# Patient Record
Sex: Female | Born: 1955 | Race: Black or African American | Hispanic: No | Smoking: Never smoker
Health system: Southern US, Community
[De-identification: ages and names within clinical notes are randomized; demographics above are authoritative.]

## PROBLEM LIST (undated history)

## (undated) HISTORY — PX: WRIST SURGERY: SHX841

## (undated) HISTORY — PX: TONSILLECTOMY: SUR1361

## (undated) HISTORY — PX: ADENOIDECTOMY: SUR15

---

## 1997-04-10 ENCOUNTER — Other Ambulatory Visit: Admission: RE | Admit: 1997-04-10 | Discharge: 1997-04-10 | Payer: Self-pay | Admitting: Obstetrics

## 1998-07-06 ENCOUNTER — Emergency Department (HOSPITAL_COMMUNITY): Admission: EM | Admit: 1998-07-06 | Discharge: 1998-07-06 | Payer: Self-pay | Admitting: Emergency Medicine

## 1999-05-07 ENCOUNTER — Encounter: Payer: Self-pay | Admitting: Family Medicine

## 1999-05-07 ENCOUNTER — Encounter: Admission: RE | Admit: 1999-05-07 | Discharge: 1999-05-07 | Payer: Self-pay | Admitting: Family Medicine

## 1999-10-06 ENCOUNTER — Encounter: Payer: Self-pay | Admitting: Emergency Medicine

## 1999-10-06 ENCOUNTER — Emergency Department (HOSPITAL_COMMUNITY): Admission: EM | Admit: 1999-10-06 | Discharge: 1999-10-06 | Payer: Self-pay | Admitting: Emergency Medicine

## 1999-10-17 ENCOUNTER — Encounter: Admission: RE | Admit: 1999-10-17 | Discharge: 1999-11-25 | Payer: Self-pay | Admitting: Family Medicine

## 2000-02-17 ENCOUNTER — Encounter: Admission: RE | Admit: 2000-02-17 | Discharge: 2000-03-26 | Payer: Self-pay | Admitting: Family Medicine

## 2000-03-22 ENCOUNTER — Emergency Department (HOSPITAL_COMMUNITY): Admission: EM | Admit: 2000-03-22 | Discharge: 2000-03-23 | Payer: Self-pay | Admitting: Emergency Medicine

## 2000-03-23 ENCOUNTER — Encounter: Payer: Self-pay | Admitting: Emergency Medicine

## 2001-07-14 ENCOUNTER — Emergency Department (HOSPITAL_COMMUNITY): Admission: EM | Admit: 2001-07-14 | Discharge: 2001-07-14 | Payer: Self-pay | Admitting: Emergency Medicine

## 2004-03-24 ENCOUNTER — Emergency Department (HOSPITAL_COMMUNITY): Admission: EM | Admit: 2004-03-24 | Discharge: 2004-03-24 | Payer: Self-pay | Admitting: Emergency Medicine

## 2004-04-02 ENCOUNTER — Ambulatory Visit (HOSPITAL_COMMUNITY): Admission: RE | Admit: 2004-04-02 | Discharge: 2004-04-02 | Payer: Self-pay | Admitting: Family Medicine

## 2004-12-15 ENCOUNTER — Emergency Department (HOSPITAL_COMMUNITY): Admission: EM | Admit: 2004-12-15 | Discharge: 2004-12-15 | Payer: Self-pay | Admitting: Emergency Medicine

## 2005-06-24 ENCOUNTER — Other Ambulatory Visit: Payer: Self-pay

## 2005-06-24 ENCOUNTER — Emergency Department: Payer: Self-pay | Admitting: Internal Medicine

## 2006-05-20 ENCOUNTER — Emergency Department (HOSPITAL_COMMUNITY): Admission: EM | Admit: 2006-05-20 | Discharge: 2006-05-20 | Payer: Self-pay | Admitting: Family Medicine

## 2006-12-27 ENCOUNTER — Emergency Department (HOSPITAL_COMMUNITY): Admission: EM | Admit: 2006-12-27 | Discharge: 2006-12-27 | Payer: Self-pay | Admitting: Emergency Medicine

## 2007-01-01 ENCOUNTER — Emergency Department (HOSPITAL_COMMUNITY): Admission: EM | Admit: 2007-01-01 | Discharge: 2007-01-01 | Payer: Self-pay | Admitting: Family Medicine

## 2007-04-18 ENCOUNTER — Emergency Department (HOSPITAL_COMMUNITY): Admission: EM | Admit: 2007-04-18 | Discharge: 2007-04-18 | Payer: Self-pay | Admitting: Emergency Medicine

## 2008-07-17 ENCOUNTER — Emergency Department (HOSPITAL_COMMUNITY): Admission: EM | Admit: 2008-07-17 | Discharge: 2008-07-17 | Payer: Self-pay | Admitting: Emergency Medicine

## 2009-08-08 ENCOUNTER — Encounter: Admission: RE | Admit: 2009-08-08 | Discharge: 2009-08-08 | Payer: Self-pay | Admitting: Orthopedic Surgery

## 2009-08-08 ENCOUNTER — Emergency Department (HOSPITAL_COMMUNITY): Admission: EM | Admit: 2009-08-08 | Discharge: 2009-08-09 | Payer: Self-pay | Admitting: Emergency Medicine

## 2009-08-10 ENCOUNTER — Ambulatory Visit (HOSPITAL_COMMUNITY): Admission: RE | Admit: 2009-08-10 | Discharge: 2009-08-11 | Payer: Self-pay | Admitting: Orthopedic Surgery

## 2010-03-21 LAB — COMPREHENSIVE METABOLIC PANEL
ALT: 32 U/L (ref 0–35)
Alkaline Phosphatase: 69 U/L (ref 39–117)
Calcium: 8.9 mg/dL (ref 8.4–10.5)
GFR calc non Af Amer: >60 mL/min (ref >60)
Glucose, Bld: 111 mg/dL — ABNORMAL HIGH (ref 70–99)
Potassium: 3.4 mEq/L — ABNORMAL LOW (ref 3.5–5.1)
Sodium: 143 mEq/L (ref 135–145)
Total Bilirubin: 0.6 mg/dL (ref 0.3–1.2)
Total Protein: 6.4 g/dL (ref 6.0–8.3)

## 2010-03-21 LAB — SURGICAL PCR SCREEN
MRSA, PCR: NEGATIVE
Staphylococcus aureus: NEGATIVE

## 2010-03-21 LAB — CBC
Platelets: 232 10*3/uL (ref 150–400)
RBC: 4 MIL/uL (ref 3.87–5.11)
RDW: 13.6 % (ref 11.5–15.5)

## 2010-10-10 LAB — INFLUENZA A AND B ANTIGEN (CONVERTED LAB)
Inflenza A Ag: POSITIVE — AB
Influenza B Ag: NEGATIVE

## 2011-08-03 ENCOUNTER — Ambulatory Visit (INDEPENDENT_AMBULATORY_CARE_PROVIDER_SITE_OTHER): Payer: BC Managed Care – PPO | Admitting: Internal Medicine

## 2011-08-03 ENCOUNTER — Ambulatory Visit: Payer: BC Managed Care – PPO

## 2011-08-03 VITALS — BP 112/72 | HR 69 | Temp 98.6°F | Resp 16

## 2011-08-03 DIAGNOSIS — M25569 Pain in unspecified knee: Secondary | ICD-10-CM

## 2011-08-03 DIAGNOSIS — M25469 Effusion, unspecified knee: Secondary | ICD-10-CM

## 2011-08-03 DIAGNOSIS — E669 Obesity, unspecified: Secondary | ICD-10-CM

## 2011-08-03 MED ORDER — MELOXICAM 15 MG PO TABS: 15.0000 mg | ORAL_TABLET | Freq: Every day | ORAL | Status: AC

## 2011-08-03 NOTE — Progress Notes (Signed)
  Subjective:    Patient ID: Deanna Park, female    DOB: Jun 29, 1955, 56 y.o.   MRN: [id number]  HPIFive-day history of severe pain the left knee/started while sitting at a desk Now hurts so bad she can't bear weight even to walk to the bathroom/she thinks there is some swelling Over the past few years she has noticed trouble with her knee stepping up but has had no injury She was here 18 months ago for left back pain with radicular symptoms into the left posterior thigh left knee This has not been a problem consistently since that visit No fever/no urinary changes    Past medical history-no illnesses or medications Review of Systems There is no other medical symptoms    Objective:   Physical Exam Vital signs stable except marked obesity Obviously uncomfortable Cannot fully extend the left knee The knee is nonswollen and there is no effusion She is very tender in the popliteal fossa extending into the very distal part of the posterior thigh Calf is nontender Varus valgus and drawer are stable/ Patellar ballots freely Nontender over the lumbar spine/straight leg raise is negative bilaterally There is no distal extremity edema/Peripheral pulses are full Just above the knee the left is 1 cm larger than the right      [facility] reading (PRIMARY) by  Dr. Arlin Savona=Normal knee   Assessment & Plan:  Problem #1 knee pain Mobic 15 #31 daily Walker for support Doppler ultrasound to rule out DVT and Baker's cyst

## 2013-01-20 ENCOUNTER — Ambulatory Visit: Payer: BC Managed Care – PPO

## 2013-01-20 ENCOUNTER — Ambulatory Visit (INDEPENDENT_AMBULATORY_CARE_PROVIDER_SITE_OTHER): Payer: BC Managed Care – PPO | Admitting: Emergency Medicine

## 2013-01-20 VITALS — BP 110/80 | HR 63 | Temp 98.7°F | Resp 16 | Ht 67.75 in | Wt 285.0 lb

## 2013-01-20 DIAGNOSIS — M25579 Pain in unspecified ankle and joints of unspecified foot: Secondary | ICD-10-CM

## 2013-01-20 DIAGNOSIS — M549 Dorsalgia, unspecified: Secondary | ICD-10-CM

## 2013-01-20 DIAGNOSIS — M5430 Sciatica, unspecified side: Secondary | ICD-10-CM

## 2013-01-20 MED ORDER — TRAMADOL HCL 50 MG PO TABS: 50.0000 mg | ORAL_TABLET | Freq: Three times a day (TID) | ORAL | Status: DC | PRN

## 2013-01-20 NOTE — Patient Instructions (Signed)
Sciatica °Sciatica is pain, weakness, numbness, or tingling along the path of the sciatic nerve. The nerve starts in the lower back and runs down the back of each leg. The nerve controls the muscles in the lower leg and in the back of the knee, while also providing sensation to the back of the thigh, lower leg, and the sole of your foot. Sciatica is a symptom of another medical condition. For instance, nerve damage or certain conditions, such as a herniated disk or bone spur on the spine, pinch or put pressure on the sciatic nerve. This causes the pain, weakness, or other sensations normally associated with sciatica. Generally, sciatica only affects one side of the body. °CAUSES  °· Herniated or slipped disc. °· Degenerative disk disease. °· A pain disorder involving the narrow muscle in the buttocks (piriformis syndrome). °· Pelvic injury or fracture. °· Pregnancy. °· Tumor (rare). °SYMPTOMS  °Symptoms can vary from mild to very severe. The symptoms usually travel from the low back to the buttocks and down the back of the leg. Symptoms can include: °· Mild tingling or dull aches in the lower back, leg, or hip. °· Numbness in the back of the calf or sole of the foot. °· Burning sensations in the lower back, leg, or hip. °· Sharp pains in the lower back, leg, or hip. °· Leg weakness. °· Severe back pain inhibiting movement. °These symptoms may get worse with coughing, sneezing, laughing, or prolonged sitting or standing. Also, being overweight may worsen symptoms. °DIAGNOSIS  °Your caregiver will perform a physical exam to look for common symptoms of sciatica. He or she may ask you to do certain movements or activities that would trigger sciatic nerve pain. Other tests may be performed to find the cause of the sciatica. These may include: °· Blood tests. °· X-rays. °· Imaging tests, such as an MRI or CT scan. °TREATMENT  °Treatment is directed at the cause of the sciatic pain. Sometimes, treatment is not necessary  and the pain and discomfort goes away on its own. If treatment is needed, your caregiver may suggest: °· Over-the-counter medicines to relieve pain. °· Prescription medicines, such as anti-inflammatory medicine, muscle relaxants, or narcotics. °· Applying heat or ice to the painful area. °· Steroid injections to lessen pain, irritation, and inflammation around the nerve. °· Reducing activity during periods of pain. °· Exercising and stretching to strengthen your abdomen and improve flexibility of your spine. Your caregiver may suggest losing weight if the extra weight makes the back pain worse. °· Physical therapy. °· Surgery to eliminate what is pressing or pinching the nerve, such as a bone spur or part of a herniated disk. °HOME CARE INSTRUCTIONS  °· Only take over-the-counter or prescription medicines for pain or discomfort as directed by your caregiver. °· Apply ice to the affected area for 20 minutes, 3 4 times a day for the first 48 72 hours. Then try heat in the same way. °· Exercise, stretch, or perform your usual activities if these do not aggravate your pain. °· Attend physical therapy sessions as directed by your caregiver. °· Keep all follow-up appointments as directed by your caregiver. °· Do not wear high heels or shoes that do not provide proper support. °· Check your mattress to see if it is too soft. A firm mattress may lessen your pain and discomfort. °SEEK IMMEDIATE MEDICAL CARE IF:  °· You lose control of your bowel or bladder (incontinence). °· You have increasing weakness in the lower back,   pelvis, buttocks, or legs. °· You have redness or swelling of your back. °· You have a burning sensation when you urinate. °· You have pain that gets worse when you lie down or awakens you at night. °· Your pain is worse than you have experienced in the past. °· Your pain is lasting longer than 4 weeks. °· You are suddenly losing weight without reason. °MAKE SURE YOU: °· Understand these  instructions. °· Will watch your condition. °· Will get help right away if you are not doing well or get worse. °Document Released: 12/16/2000 Document Revised: 06/23/2011 Document Reviewed: 05/03/2011 °ExitCare® Patient Information ©2014 [employer]. ° °

## 2013-01-20 NOTE — Progress Notes (Addendum)
[facility] [address], [place] [address] [phone]  Date:  01/20/2013   Name:  Deanna Park   DOB:  1955/11/12   MRN:  [id number]  PCP:  No primary provider on file.    Chief Complaint: Leg Pain   History of Present Illness:  Deanna Park is a 58 y.o. very pleasant female patient who presents with the following:  Tripped and fell in October and injured her left midfoot. Says it swelled and hurt at the time.  No ecchymosis.  Still has sharp pain with standing and ambulation. No recurrent injury.  Developed pain in the left lower back after the fall and since has had numbness in the left thigh.  Some weakness in knee when climbing stairs.  Says leg has not "gone out".   No history of prior back injury or pain.  No improvement with over the counter medications or other home remedies.  Denies other complaint or health concern today.    There are no active problems to display for this patient.   History reviewed. No pertinent past medical history.  History reviewed. No pertinent past surgical history.  History  Substance Use Topics  . Smoking status: Never Smoker   . Smokeless tobacco: Not on file  . Alcohol Use: Not on file    Family History  Problem Relation Age of Onset  . Cancer Mother   . Diabetes Father   . Heart disease Father     Allergies  Allergen Reactions  . Sulfa Antibiotics Itching and Rash    Medication list has been reviewed and updated.  Current Outpatient Prescriptions on File Prior to Visit  Medication Sig Dispense Refill  . acetaminophen (TYLENOL) 500 MG tablet Take 500 mg by mouth every 6 (six) hours as needed.       No current facility-administered medications on file prior to visit.    Review of Systems:  As per HPI, otherwise negative.    Physical Examination: Filed Vitals:   01/20/13 1441  BP: 110/80  Pulse: 63  Temp: 98.7 F (37.1 C)  Resp: 16   Filed Vitals:   01/20/13 1441   Height: 5' 7.75" (1.721 m)  Weight: 285 lb (129.275 kg)   Body mass index is 43.65 kg/(m^2). Ideal Body Weight: Weight in (lb) to have BMI = 25: 162.9  GEN: morbidly obese, moderate distress, Non-toxic, A & O x 3 HEENT: Atraumatic, Normocephalic. Neck supple. No masses, No LAD. Ears and Nose: No external deformity. CV: RRR, No M/G/R. No JVD. No thrill. No extra heart sounds. PULM: CTA B, no wheezes, crackles, rhonchi. No retractions. No resp. distress. No accessory muscle use. ABD: S, NT, ND, +BS. No rebound. No HSM. EXTR: No c/c/e NEURO antalgic gait. Gross motor and cerebellar intact.   Hypesthesia left lateral thigh PSYCH: Normally interactive. Conversant. Not depressed or anxious appearing.  Calm demeanor.  Back:  Tender sciatic notch on left.  Assessment and Plan: Sciatic neuritis Paresthesias Ultram  Signed,  Phillips OdorJeffery Teren Zurcher, MD   [facility] reading (PRIMARY) by  Dr. Dareen PianoAnderson.  Negative LS spine.  [facility] reading (PRIMARY) by  Dr. Dareen PianoAnderson.  Negative foot.

## 2013-01-24 ENCOUNTER — Telehealth: Payer: Self-pay

## 2013-01-24 NOTE — Telephone Encounter (Signed)
Authorization # - [id number] expires 01/21/2013

## 2013-01-24 NOTE — Telephone Encounter (Signed)
[employer] insurance) left referrals vmail requesting peer to peer for authorization.  [id number]  bf

## 2013-01-24 NOTE — Telephone Encounter (Signed)
VM did not include a case #, but most current ins ID # is [id number].

## 2013-08-29 ENCOUNTER — Ambulatory Visit (INDEPENDENT_AMBULATORY_CARE_PROVIDER_SITE_OTHER): Payer: BC Managed Care – PPO | Admitting: Family Medicine

## 2013-08-29 ENCOUNTER — Ambulatory Visit (INDEPENDENT_AMBULATORY_CARE_PROVIDER_SITE_OTHER): Payer: BC Managed Care – PPO

## 2013-08-29 VITALS — BP 114/68 | HR 65 | Temp 97.7°F | Resp 16 | Ht 68.0 in | Wt 285.6 lb

## 2013-08-29 DIAGNOSIS — J209 Acute bronchitis, unspecified: Secondary | ICD-10-CM

## 2013-08-29 DIAGNOSIS — J22 Unspecified acute lower respiratory infection: Secondary | ICD-10-CM

## 2013-08-29 DIAGNOSIS — R05 Cough: Secondary | ICD-10-CM

## 2013-08-29 DIAGNOSIS — R059 Cough, unspecified: Secondary | ICD-10-CM

## 2013-08-29 DIAGNOSIS — J988 Other specified respiratory disorders: Secondary | ICD-10-CM

## 2013-08-29 MED ORDER — HYDROCODONE-HOMATROPINE 5-1.5 MG/5ML PO SYRP: 5.0000 mL | ORAL_SOLUTION | Freq: Every evening | ORAL | Status: DC | PRN

## 2013-08-29 MED ORDER — BENZONATATE 100 MG PO CAPS: 200.0000 mg | ORAL_CAPSULE | Freq: Two times a day (BID) | ORAL | Status: DC | PRN

## 2013-08-29 MED ORDER — AZITHROMYCIN 250 MG PO TABS: ORAL_TABLET | ORAL | Status: DC

## 2013-08-29 MED ORDER — ALBUTEROL SULFATE HFA 108 (90 BASE) MCG/ACT IN AERS: 2.0000 | INHALATION_SPRAY | Freq: Four times a day (QID) | RESPIRATORY_TRACT | Status: DC | PRN

## 2013-08-29 NOTE — Progress Notes (Signed)
Chief Complaint:  Chief Complaint  Patient presents with  . Cough    x 1 month  . Wheezing    x 2 weeks at night     HPI: Deanna Park is a 58 y.o. female who is here URI sxs for last 3-4 week  since she came back from [place] , she has had a productive cough of clear sputum  and intermittent wheezing and has sinus issues, she had been there before and her sinuses where acting up and she took otc meds and it resolved, but this last visit, she has been wheezing and coughing ever since , she has phlegm and sometimes the coughing was so bad she felt she was being strangled , she has a tickle in the back of her throat. She is not taking anythign for her sinuses. 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 She does not have asthma. She has had bronchtiis before. She denies fevers or chills.   No past medical history on file. No past surgical history on file. History   Social History  . Marital Status: Legally Separated    Spouse Name: N/A    Number of Children: N/A  . Years of Education: N/A   Social History Main Topics  . Smoking status: Never Smoker   . Smokeless tobacco: None  . Alcohol Use: No  . Drug Use: No  . Sexual Activity: None   Other Topics Concern  . None   Social History Narrative  . None   Family History  Problem Relation Age of Onset  . Cancer Mother   . Diabetes Father   . Heart disease Father    Allergies  Allergen Reactions  . Sulfa Antibiotics Itching and Rash   Prior to Admission medications   Not on File     ROS: The patient denies fevers, chills, night sweats, unintentional weight loss, chest pain, palpitation, dyspnea on exertion, nausea, vomiting, abdominal pain, dysuria, hematuria, melena, numbness, weakness, or tingling.   All other  systems have been reviewed and were otherwise negative with the exception of those mentioned in the HPI and as above.    PHYSICAL EXAM: Filed Vitals:   08/29/13 1132  BP: 114/68  Pulse: 65  Temp: 97.7 F (36.5 C)  Resp: 16   Filed Vitals:   08/29/13 1132  Height: $Remove'5\' 8"'Tsbotoi$  (1.727 m)  Weight: 285 lb 9.6 oz (129.547 kg)   Body mass index is 43.44 kg/(m^2).  General: Alert, no acute distress HEENT:  Normocephalic, atraumatic, oropharynx patent. EOMI, PERRLA, tm normal, + min sinus tenderness, no exudates Cardiovascular:  Regular rate and rhythm, no rubs murmurs or gallops.  No Carotid bruits, radial pulse intact. No pedal edema.  Respiratory: Clear to auscultation bilaterally.  No wheezes, rales, or rhonchi.  No cyanosis, no use of accessory musculature GI: No organomegaly, abdomen is soft and non-tender, positive bowel sounds.  No masses. Skin: No rashes. Neurologic: Facial musculature symmetric. Psychiatric: Patient is appropriate throughout our interaction. Lymphatic: No cervical lymphadenopathy Musculoskeletal: Gait intact.   LABS: Results for orders placed during the hospital encounter of 08/10/09  SURGICAL PCR SCREEN      Result Value Ref Range   MRSA, PCR NEGATIVE  NEGATIVE   Staphylococcus aureus  NEGATIVE   Value: NEGATIVE            The Xpert SA Assay (FDA     approved for NASAL specimens     only), is one component of     a comprehensive surveillance     program.  It is not intended     to diagnose infection nor to     guide or monitor treatment.  CBC      Result Value Ref Range   WBC 9.9  4.0 - 10.5 K/uL   RBC 4.00  3.87 - 5.11 MIL/uL   Hemoglobin 11.2 (*) 12.0 - 15.0 g/dL   HCT 32.4 (*) 36.0 - 46.0 %   MCV 81.0  78.0 - 100.0 fL   MCH 28.0  26.0 - 34.0 pg   MCHC 34.6  30.0 - 36.0 g/dL   RDW 13.6  11.5 - 15.5 %   Platelets 232  150 - 400 K/uL  COMPREHENSIVE METABOLIC PANEL      Result Value Ref Range   Sodium 143  135 - 145 mEq/L   Potassium 3.4 (*) 3.5  - 5.1 mEq/L   Chloride 109  96 - 112 mEq/L   CO2 26  19 - 32 mEq/L   Glucose, Bld 111 (*) 70 - 99 mg/dL   BUN 5 (*) 6 - 23 mg/dL   Creatinine, Ser 0.75  0.4 - 1.2 mg/dL   Calcium 8.9  8.4 - 10.5 mg/dL   Total Protein 6.4  6.0 - 8.3 g/dL   Albumin 3.5  3.5 - 5.2 g/dL   AST 63 (*) 0 - 37 U/L   ALT 32  0 - 35 U/L   Alkaline Phosphatase 69  39 - 117 U/L   Total Bilirubin 0.6  0.3 - 1.2 mg/dL   GFR calc non Af Amer >60  >60 mL/min   GFR calc Af Amer    >60 mL/min   Value: >60            The eGFR has been calculated     using the MDRD equation.     This calculation has not been     validated in all clinical     situations.     eGFR's persistently     <60 mL/min signify     possible Chronic Kidney Disease.     EKG/XRAY:   Primary read interpreted by Dr. Marin [facility] at [facility]. No acute caridopulmonary process   ASSESSMENT/PLAN: Encounter Diagnoses  Name Primary?  . Cough Yes  . Lower respiratory infection   . Acute bronchitis, unspecified organism    Rx zpack, tessalon perles, hycodan, and albuterol inhaler Otc nasacort and also antihistamine  Work note given F/u prn  Gross sideeffects, risk and benefits, and alternatives of medications d/w patient. Patient is aware that all medications have potential sideeffects and we are unable to predict every sideeffect or drug-drug interaction that may occur.  Mairyn Lenahan, [place], DO 08/29/2013 1:07 PM

## 2013-08-29 NOTE — Patient Instructions (Signed)
Acute Bronchitis Bronchitis is inflammation of the airways that extend from the windpipe into the lungs (bronchi). The inflammation often causes mucus to develop. This leads to a cough, which is the most common symptom of bronchitis.  In acute bronchitis, the condition usually develops suddenly and goes away over time, usually in a couple weeks. Smoking, allergies, and asthma can make bronchitis worse. Repeated episodes of bronchitis may cause further lung problems.  CAUSES Acute bronchitis is most often caused by the same virus that causes a cold. The virus can spread from person to person (contagious) through coughing, sneezing, and touching contaminated objects. SIGNS AND SYMPTOMS   Cough.   Fever.   Coughing up mucus.   Body aches.   Chest congestion.   Chills.   Shortness of breath.   Sore throat.  DIAGNOSIS  Acute bronchitis is usually diagnosed through a physical exam. Your health care provider will also ask you questions about your medical history. Tests, such as chest X-rays, are sometimes done to rule out other conditions.  TREATMENT  Acute bronchitis usually goes away in a couple weeks. Oftentimes, no medical treatment is necessary. Medicines are sometimes given for relief of fever or cough. Antibiotic medicines are usually not needed but may be prescribed in certain situations. In some cases, an inhaler may be recommended to help reduce shortness of breath and control the cough. A cool mist vaporizer may also be used to help thin bronchial secretions and make it easier to clear the chest.  HOME CARE INSTRUCTIONS  Get plenty of rest.   Drink enough fluids to keep your urine clear or pale yellow (unless you have a medical condition that requires fluid restriction). Increasing fluids may help thin your respiratory secretions (sputum) and reduce chest congestion, and it will prevent dehydration.   Take medicines only as directed by your health care provider.  If  you were prescribed an antibiotic medicine, finish it all even if you start to feel better.  Avoid smoking and secondhand smoke. Exposure to cigarette smoke or irritating chemicals will make bronchitis worse. If you are a smoker, consider using nicotine gum or skin patches to help control withdrawal symptoms. Quitting smoking will help your lungs heal faster.   Reduce the chances of another bout of acute bronchitis by washing your hands frequently, avoiding people with cold symptoms, and trying not to touch your hands to your mouth, nose, or eyes.   Keep all follow-up visits as directed by your health care provider.  SEEK MEDICAL CARE IF: Your symptoms do not improve after 1 week of treatment.  SEEK IMMEDIATE MEDICAL CARE IF:  You develop an increased fever or chills.   You have chest pain.   You have severe shortness of breath.  You have bloody sputum.   You develop dehydration.  You faint or repeatedly feel like you are going to pass out.  You develop repeated vomiting.  You develop a severe headache. MAKE SURE YOU:   Understand these instructions.  Will watch your condition.  Will get help right away if you are not doing well or get worse. Document Released: 01/30/2004 Document Revised: 05/08/2013 Document Reviewed: 06/14/2012 ExitCare Patient Information 2015 [employer]. This information is not intended to replace advice given to you by your health care provider. Make sure you discuss any questions you have with your health care provider. Sinusitis Sinusitis is redness, soreness, and inflammation of the paranasal sinuses. Paranasal sinuses are air pockets within the bones of your face (beneath the   eyes, the middle of the forehead, or above the eyes). In healthy paranasal sinuses, mucus is able to drain out, and air is able to circulate through them by way of your nose. However, when your paranasal sinuses are inflamed, mucus and air can become trapped. This can  allow bacteria and other germs to grow and cause infection. Sinusitis can develop quickly and last only a short time (acute) or continue over a long period (chronic). Sinusitis that lasts for more than 12 weeks is considered chronic.  CAUSES  Causes of sinusitis include:  Allergies.  Structural abnormalities, such as displacement of the cartilage that separates your nostrils (deviated septum), which can decrease the air flow through your nose and sinuses and affect sinus drainage.  Functional abnormalities, such as when the small hairs (cilia) that line your sinuses and help remove mucus do not work properly or are not present. SIGNS AND SYMPTOMS  Symptoms of acute and chronic sinusitis are the same. The primary symptoms are pain and pressure around the affected sinuses. Other symptoms include:  Upper toothache.  Earache.  Headache.  Bad breath.  Decreased sense of smell and taste.  A cough, which worsens when you are lying flat.  Fatigue.  Fever.  Thick drainage from your nose, which often is green and may contain pus (purulent).  Swelling and warmth over the affected sinuses. DIAGNOSIS  Your health care provider will perform a physical exam. During the exam, your health care provider may:  Look in your nose for signs of abnormal growths in your nostrils (nasal polyps).  Tap over the affected sinus to check for signs of infection.  View the inside of your sinuses (endoscopy) using an imaging device that has a light attached (endoscope). If your health care provider suspects that you have chronic sinusitis, one or more of the following tests may be recommended:  Allergy tests.  Nasal culture. A sample of mucus is taken from your nose, sent to a lab, and screened for bacteria.  Nasal cytology. A sample of mucus is taken from your nose and examined by your health care provider to determine if your sinusitis is related to an allergy. TREATMENT  Most cases of acute  sinusitis are related to a viral infection and will resolve on their own within 10 days. Sometimes medicines are prescribed to help relieve symptoms (pain medicine, decongestants, nasal steroid sprays, or saline sprays).  However, for sinusitis related to a bacterial infection, your health care provider will prescribe antibiotic medicines. These are medicines that will help kill the bacteria causing the infection.  Rarely, sinusitis is caused by a fungal infection. In theses cases, your health care provider will prescribe antifungal medicine. For some cases of chronic sinusitis, surgery is needed. Generally, these are cases in which sinusitis recurs more than 3 times per year, despite other treatments. HOME CARE INSTRUCTIONS   Drink plenty of water. Water helps thin the mucus so your sinuses can drain more easily.  Use a humidifier.  Inhale steam 3 to 4 times a day (for example, sit in the bathroom with the shower running).  Apply a warm, moist washcloth to your face 3 to 4 times a day, or as directed by your health care provider.  Use saline nasal sprays to help moisten and clean your sinuses.  Take medicines only as directed by your health care provider.  If you were prescribed either an antibiotic or antifungal medicine, finish it all even if you start to feel better. SEEK IMMEDIATE MEDICAL   CARE IF:  You have increasing pain or severe headaches.  You have nausea, vomiting, or drowsiness.  You have swelling around your face.  You have vision problems.  You have a stiff neck.  You have difficulty breathing. MAKE SURE YOU:   Understand these instructions.  Will watch your condition.  Will get help right away if you are not doing well or get worse. Document Released: 12/22/2004 Document Revised: 05/08/2013 Document Reviewed: 01/06/2011 ExitCare Patient Information 2015 [employer]. This information is not intended to replace advice given to you by your health care provider.  Make sure you discuss any questions you have with your health care provider.  

## 2013-09-20 ENCOUNTER — Ambulatory Visit (INDEPENDENT_AMBULATORY_CARE_PROVIDER_SITE_OTHER): Payer: BC Managed Care – PPO

## 2015-08-25 ENCOUNTER — Encounter (HOSPITAL_COMMUNITY): Payer: Self-pay | Admitting: Family Medicine

## 2015-08-25 ENCOUNTER — Ambulatory Visit (HOSPITAL_COMMUNITY)
Admission: EM | Admit: 2015-08-25 | Discharge: 2015-08-25 | Disposition: A | Discharge status: Home / Self Care | Payer: BLUE CROSS/BLUE SHIELD | Attending: Family Medicine | Admitting: Family Medicine

## 2015-08-25 DIAGNOSIS — M545 Low back pain: Secondary | ICD-10-CM | POA: Diagnosis not present

## 2015-08-25 MED ORDER — PREDNISONE 20 MG PO TABS: ORAL_TABLET | ORAL | 0 refills | Status: DC

## 2015-08-25 NOTE — ED Provider Notes (Addendum)
[facility]    CSN: [id number] Arrival date & time: 08/25/15  1228  First Provider Contact:  First MD Initiated Contact with Patient 08/25/15 1349        History   Chief Complaint Chief Complaint  Patient presents with  . Back Pain    HPI Deanna Park is a 60 y.o. female.   This is 60 year old woman who presents with back pain. She first started having the pain August 1. The pain seemed to be relieved by ibuprofen but when the pain was less, she started doing more and the pain worsen.  She is on a bus several days ago and [place] [facility] seemed aggravate the back.  Patient works as an Wellsite geologistart teacher in school system.  Patient denies fever, urinary incontinence, fecal incontinence, radiating pain down her legs, weakness, numbness, or significant trauma.  Patient has had some relief using a back brace.      History reviewed. No pertinent past medical history.  There are no active problems to display for this patient.   Past Surgical History:  Procedure Laterality Date  . WRIST SURGERY      OB History    No data available       Home Medications    Prior to Admission medications   Medication Sig Start Date End Date Taking? Authorizing Provider  albuterol (PROVENTIL HFA;VENTOLIN HFA) 108 (90 BASE) MCG/ACT inhaler Inhale 2 puffs into the lungs every 6 (six) hours as needed for wheezing or shortness of breath. 08/29/13   Thao P Le, DO  benzonatate (TESSALON) 100 MG capsule Take 2 capsules (200 mg total) by mouth 2 (two) times daily as needed. 08/29/13   Thao P Le, DO  predniSONE (DELTASONE) 20 MG tablet Two daily with food 08/25/15   Elvina SidleKurt Reyanna Baley, MD    Family History Family History  Problem Relation Age of Onset  . Cancer Mother   . Diabetes Father   . Heart disease Father     Social History Social History  Substance Use Topics  . Smoking status: Never Smoker  . Smokeless tobacco: Not on file  . Alcohol  use No     Allergies   Azithromycin; Red dye; and Sulfa antibiotics   Review of Systems Review of Systems   Physical Exam Triage Vital Signs ED Triage Vitals  Enc Vitals Group     BP      Pulse      Resp      Temp      Temp src      SpO2      Weight      Height      Head Circumference      Peak Flow      Pain Score      Pain Loc      Pain Edu?      Excl. in GC?    No data found.   Updated Vital Signs BP 133/72   Pulse 70   Temp 97.1 F (36.2 C) (Oral)   Resp 18   SpO2 98%   Visual Acuity Right Eye Distance:   Left Eye Distance:   Bilateral Distance:    Right Eye Near:   Left Eye Near:    Bilateral Near:     Physical Exam  Constitutional: She is oriented to person, place, and time. She appears well-developed and well-nourished.  HENT:  Head: Normocephalic.  Eyes: Conjunctivae are normal. Pupils are equal,  round, and reactive to light.  Neck: Normal range of motion. Neck supple.  Pulmonary/Chest: Effort normal.  Abdominal: Soft.  Musculoskeletal: Normal range of motion.  Neurological: She is alert and oriented to person, place, and time.  Normal muscle mass bilaterally Normal straight leg raising   Skin: Skin is warm and dry.  Psychiatric: She has a normal mood and affect.  Nursing note and vitals reviewed.    UC Treatments / Results  Labs (all labs ordered are listed, but only abnormal results are displayed) Labs Reviewed - No data to display  EKG  EKG Interpretation None       Radiology No results found.  Procedures Procedures (including critical care time)  Medications Ordered in UC Medications - No data to display   Initial Impression / Assessment and Plan / UC Course  I have reviewed the triage vital signs and the nursing notes.  Pertinent labs & imaging results that were available during my care of the patient were reviewed by me and considered in my medical decision making (see chart for details).  Clinical Course       Final Clinical Impressions(s) / UC Diagnoses   Final diagnoses:  Right low back pain, with sciatica presence unspecified    New Prescriptions New Prescriptions   PREDNISONE (DELTASONE) 20 MG TABLET    Two daily with food  Lumbar support, out of work until Wednesday   Elvina SidleKurt Elika Godar, MD 08/25/15 1406    Elvina SidleKurt Keeshia Sanderlin, MD 08/25/15 2024

## 2015-08-25 NOTE — ED Triage Notes (Signed)
Reports fall 8/1 from tripping; had been applying heat and wearing a "back wrap" which helped, then aggravated pain again 2 days ago when she tripped again.

## 2015-08-25 NOTE — Discharge Instructions (Signed)
You'll need to pick up your lumbar support had either a medical supply office or the pharmacy.

## 2016-03-01 ENCOUNTER — Ambulatory Visit (HOSPITAL_COMMUNITY)
Admission: EM | Admit: 2016-03-01 | Discharge: 2016-03-01 | Disposition: A | Discharge status: Home / Self Care | Payer: BLUE CROSS/BLUE SHIELD | Attending: Family Medicine | Admitting: Family Medicine

## 2016-03-01 ENCOUNTER — Encounter (HOSPITAL_COMMUNITY): Payer: Self-pay | Admitting: *Deleted

## 2016-03-01 DIAGNOSIS — J0100 Acute maxillary sinusitis, unspecified: Secondary | ICD-10-CM | POA: Diagnosis not present

## 2016-03-01 DIAGNOSIS — M12812 Other specific arthropathies, not elsewhere classified, left shoulder: Secondary | ICD-10-CM

## 2016-03-01 DIAGNOSIS — R05 Cough: Secondary | ICD-10-CM | POA: Diagnosis not present

## 2016-03-01 DIAGNOSIS — R059 Cough, unspecified: Secondary | ICD-10-CM

## 2016-03-01 MED ORDER — PREDNISONE 20 MG PO TABS: ORAL_TABLET | ORAL | 0 refills | Status: DC

## 2016-03-01 MED ORDER — AMOXICILLIN-POT CLAVULANATE 875-125 MG PO TABS: 1.0000 | ORAL_TABLET | Freq: Two times a day (BID) | ORAL | 0 refills | Status: DC

## 2016-03-01 NOTE — ED Triage Notes (Signed)
Also c/o left arm pain without known injury.  Pain remains constant; pain worse with ROM and particular movements.

## 2016-03-01 NOTE — ED Triage Notes (Signed)
Started 8 days ago with runny nose, congestion, sinus pressure, sore throat, feeling feverish.  Has been taking Vit C & ASA.

## 2016-03-01 NOTE — ED Provider Notes (Signed)
[facility]    CSN: [id number] Arrival date & time: 03/01/16  1333     History   Chief Complaint Chief Complaint  Patient presents with  . Nasal Congestion  . Facial Pain  . Arm Pain    HPI Deanna Park is a 61 y.o. female.   This is 61 year old woman who has 2 different problems. She has upper respiratory symptoms with cough and congestion. She also has left arm pain.  She's had a cough for about a week. This is developed into facial pain as well.    she's had left arm and shoulder pain since she was moving some costumes several weeks ago. She has pain when she reaches behind her back and when she raises her arm overhead. When she is at rest, she has no pain      History reviewed. No pertinent past medical history.  There are no active problems to display for this patient.   Past Surgical History:  Procedure Laterality Date  . WRIST SURGERY      OB History    No data available       Home Medications    Prior to Admission medications   Medication Sig Start Date End Date Taking? Authorizing Provider  amoxicillin-clavulanate (AUGMENTIN) 875-125 MG tablet Take 1 tablet by mouth every 12 (twelve) hours. 03/01/16   Elvina Sidle, MD  predniSONE (DELTASONE) 20 MG tablet Two daily with food 03/01/16   Elvina Sidle, MD    Family History Family History  Problem Relation Age of Onset  . Cancer Mother   . Diabetes Father   . Heart disease Father     Social History Social History  Substance Use Topics  . Smoking status: Never Smoker  . Smokeless tobacco: Not on file  . Alcohol use No     Allergies   Azithromycin; Red dye; and Sulfa antibiotics   Review of Systems Review of Systems  Constitutional: Negative.   HENT: Positive for sinus pain, sinus pressure and sore throat.   Respiratory: Positive for cough.   Cardiovascular: Negative.   Gastrointestinal: Negative.   Musculoskeletal: Positive for arthralgias.  Neurological:  Negative.      Physical Exam Triage Vital Signs ED Triage Vitals  Enc Vitals Group     BP 03/01/16 1358 127/61     Pulse Rate 03/01/16 1358 70     Resp 03/01/16 1358 18     Temp 03/01/16 1358 98.6 F (37 C)     Temp Source 03/01/16 1358 Oral     SpO2 03/01/16 1358 98 %     Weight --      Height --      Head Circumference --      Peak Flow --      Pain Score 03/01/16 1359 0     Pain Loc --      Pain Edu? --      Excl. in GC? --    No data found.   Updated Vital Signs BP 127/61   Pulse 70   Temp 98.6 F (37 C) (Oral)   Resp 18   SpO2 98%    Physical Exam  Constitutional: She is oriented to person, place, and time. She appears well-developed and well-nourished.  HENT:  Head: Normocephalic.  Right Ear: External ear normal.  Left Ear: External ear normal.  Mouth/Throat: Oropharynx is clear and moist.  Eyes: Conjunctivae and EOM are normal. Pupils are equal, round, and reactive to light.  Neck: Normal  range of motion. Neck supple.  Cardiovascular: Normal rate, regular rhythm and normal heart sounds.   Pulmonary/Chest: Effort normal and breath sounds normal.  Musculoskeletal: Normal range of motion. She exhibits no tenderness.  Pain when abducting left arm or reaching behind her back  Neurological: She is alert and oriented to person, place, and time.  Skin: Skin is warm and dry.  Vitals reviewed.    UC Treatments / Results  Labs (all labs ordered are listed, but only abnormal results are displayed) Labs Reviewed - No data to display  EKG  EKG Interpretation None       Radiology No results found.  Procedures Procedures (including critical care time)  Medications Ordered in UC Medications - No data to display   Initial Impression / Assessment and Plan / UC Course  I have reviewed the triage vital signs and the nursing notes.  Pertinent labs & imaging results that were available during my care of the patient were reviewed by me and considered in  my medical decision making (see chart for details).     Final Clinical Impressions(s) / UC Diagnoses   Final diagnoses:  Cough  Acute non-recurrent maxillary sinusitis    New Prescriptions New Prescriptions   AMOXICILLIN-CLAVULANATE (AUGMENTIN) 875-125 MG TABLET    Take 1 tablet by mouth every 12 (twelve) hours.   PREDNISONE (DELTASONE) 20 MG TABLET    Two daily with food     Elvina SidleKurt Athan Casalino, MD 03/01/16 1429

## 2016-06-15 ENCOUNTER — Ambulatory Visit (HOSPITAL_COMMUNITY)
Admission: EM | Admit: 2016-06-15 | Discharge: 2016-06-15 | Disposition: A | Discharge status: Home / Self Care | Payer: BLUE CROSS/BLUE SHIELD | Attending: Internal Medicine | Admitting: Internal Medicine

## 2016-06-15 ENCOUNTER — Encounter (HOSPITAL_COMMUNITY): Payer: Self-pay | Admitting: Emergency Medicine

## 2016-06-15 ENCOUNTER — Telehealth (HOSPITAL_COMMUNITY): Payer: Self-pay | Admitting: Emergency Medicine

## 2016-06-15 ENCOUNTER — Ambulatory Visit (INDEPENDENT_AMBULATORY_CARE_PROVIDER_SITE_OTHER): Payer: BLUE CROSS/BLUE SHIELD

## 2016-06-15 DIAGNOSIS — M25571 Pain in right ankle and joints of right foot: Secondary | ICD-10-CM | POA: Diagnosis not present

## 2016-06-15 DIAGNOSIS — S0093XA Contusion of unspecified part of head, initial encounter: Secondary | ICD-10-CM

## 2016-06-15 DIAGNOSIS — S93401A Sprain of unspecified ligament of right ankle, initial encounter: Secondary | ICD-10-CM

## 2016-06-15 MED ORDER — NAPROXEN 500 MG PO TABS: 500.0000 mg | ORAL_TABLET | Freq: Two times a day (BID) | ORAL | 0 refills | Status: DC
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
## 2016-06-15 NOTE — Telephone Encounter (Signed)
[facility]. Patient was advised STD tests were negative and to follow up with PCP in reference to Urine Culture if symptoms continued.
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
## 2016-06-15 NOTE — Discharge Instructions (Addendum)
No danger signs on exam today.  Ice for 5-10 minutes several times daily will help with pain/swelling on forehead and right ankle.  Wear stirrup splint on right ankle to help with discomfort.  Prescription for naproxen, an anti inflammatory, was sent to the pharmacy.  Anticipate gradual improvement over the next several days in right ankle pain and left forehead pain.  Recheck for further evaluation if symptoms are not improving as expected.

## 2016-07-11 ENCOUNTER — Encounter: Payer: Self-pay | Admitting: Family Medicine

## 2016-07-11 ENCOUNTER — Ambulatory Visit (INDEPENDENT_AMBULATORY_CARE_PROVIDER_SITE_OTHER): Payer: BLUE CROSS/BLUE SHIELD | Admitting: Family Medicine

## 2016-07-11 ENCOUNTER — Ambulatory Visit (INDEPENDENT_AMBULATORY_CARE_PROVIDER_SITE_OTHER): Payer: BLUE CROSS/BLUE SHIELD

## 2016-07-11 VITALS — BP 124/70 | HR 65 | Temp 99.1°F | Resp 16 | Ht 68.0 in | Wt 283.2 lb

## 2016-07-11 DIAGNOSIS — M949 Disorder of cartilage, unspecified: Secondary | ICD-10-CM

## 2016-07-11 DIAGNOSIS — S99921A Unspecified injury of right foot, initial encounter: Secondary | ICD-10-CM | POA: Diagnosis not present

## 2016-07-11 DIAGNOSIS — M899 Disorder of bone, unspecified: Secondary | ICD-10-CM | POA: Diagnosis not present

## 2016-07-11 DIAGNOSIS — S99911A Unspecified injury of right ankle, initial encounter: Secondary | ICD-10-CM | POA: Diagnosis not present

## 2016-07-11 NOTE — Patient Instructions (Addendum)
IF you received an x-ray today, you will receive an invoice from [facility]. Please contact [facility] at [phone] with questions or concerns regarding your invoice.   IF you received labwork today, you will receive an invoice from [place]. Please contact [facility] at [phone] with questions or concerns regarding your invoice.   Our billing staff will not be able to assist you with questions regarding bills from these companies.  You will be contacted with the lab results as soon as they are available. The fastest way to get your results is to activate your My Chart account. Instructions are located on the last page of this paperwork. If you have not heard from [place] regarding the results in 2 weeks, please contact this office.    We recommend that you schedule a mammogram for breast cancer screening. Typically, you do not need a referral to do this. Please contact a local imaging center to schedule your mammogram.  [facility] - [phone]  *ask for the Radiology Department [facility]) - [phone] or [phone]  [facility] - [phone] [facility] - [phone] [facility] - [phone]  *ask for the Radiology Department [facility] - [phone]  *ask for the Radiology Department [facility] - [phone]  *ask for the Mammography Department [facility] - [phone]   Walking Boot, Adult A walking boot is a medical device that holds your foot or ankle in place after an injury or a medical procedure. This helps with healing and prevents further injury. A walking boot is a removable boot-shaped splint. It has a hard, rigid outer frame that limits movement and supports your foot and leg. The inner lining is a layer of padded material. Walking boots usually have adjustable straps to secure them over the foot and leg. Your health care  provider may prescribe a walking boot if you can put weight (bear weight) on your injured foot. How much you can walk while wearing the boot will depend on the type and severity of your injury. Your health care provider will recommend the best boot for you based on your condition. How do I put on my walking boot? There are different types of walking boots. Each type of boot has specific instructions about how to wear it properly. Follow instructions from your health care provider about wearing your boot. In general:  Ask someone to help you put on the boot, if needed.  Sit to put on your boot. Doing this is more comfortable and it helps to prevent falls.  Open up the boot fully. Place your foot into the boot so your heel rests against the back.  Your toes should be supported by the base of the boot. They should not hang over the front edge.  Adjust the straps so the boot fits securely but is not too tight.  Do not bend the hard frame of the boot to get a good fit.  What are some tips for walking with a walking boot?  Do not try to walk without wearing the boot unless your health care provider has approved.  Use other assistive walking devices, including crutches and canes, as told by your health care provider.  On your uninjured foot, wear a shoe with a heel that is close to the height of the walking boot.  Be careful when walking on surfaces that are uneven or wet. How can I reduce  swelling while using a walking boot?  Rest your injured foot or leg as much as possible.  If directed, apply ice to the injured area: ? Put ice in a plastic bag. ? Place a towel between your skin and the bag. ? Leave the ice on for 20 minutes, 2-3 times a day.  Keep your injured foot or leg raised (elevated) above the level of your heart whenever able. Try to do this for at least 2?3 hours each day or as told by your health care provider.  If swelling gets worse, loosen the boot and rest and elevate  your foot and leg. How should I take care of my skin and foot while using a walking boot?  Wear a long sock to protect your foot and leg from rubbing inside the boot.  Take off the boot one time each day to check the injured area. Look at your foot, surrounding skin, and leg to make sure there are no sores, rashes, swelling, or wounds. The skin should be a healthy color, not pale or blue.  Try to notice if your walking pattern (gait) in the boot is fairly normal and that you are not walking with a noticeable limp.  Follow instructions from your health care provider about taking care of your incision or wound, if this applies.  Clean and wash the injured area as told by your health care provider.  Gently dry your foot and leg before putting the boot back on. Are there any activity restrictions? Activity restrictions depend on the type and severity of your injury. Follow instructions from your health care provider about limiting activities.  Bathe and shower as told by your health care provider.  Do not do any activities that could make your injury worse.  Do not drive if your affected foot is one that you usually use for driving.  When can I remove my walking boot? Always follow specific directions from your health care provider for removing the walking boot. Generally, it is okay to remove your walking boot:  At the end of the day when you are resting or sleeping.  To clean your foot and leg.  How should I keep my walking boot clean?  Do not put any part of the boot in a washing machine or dryer.  Do not use chemical cleaning products. These could irritate your skin, especially if you have a wound or an incision.  Do not soak the liner of the boot.  Use a washcloth with mild soap and water to clean the frame and the liner of the boot by hand.  Allow the boot to air-dry completely before you put it back on your foot. Contact a health care provider if:  The boot is cracked or  damaged.  The boot does not fit properly.  Your foot or leg hurts.  You have a rash, sore, or open sore (ulcer) on your foot or leg.  The skin on your foot or leg is pale.  You have a wound or incision on the foot and it is getting worse.  Your skin becomes painful, red, or irritated.  Your swelling does not get better or it gets worse. Get help right away if:  You cannot feel your foot or leg (have numbness).  You cannot feel a pulse at the top of your foot, where your foot and ankle meet.  Your skin on the foot or leg is cold, blue, or gray. Summary  A walking boot holds your foot  or ankle in place after an injury or a medical procedure.  There are different types of walking boots. Follow the specific instructions about how to correctly wear the boot that you have.  Ask someone to help you put on the boot, if needed.  It is important to check your skin and foot every day. Call your health care provider if you notice a rash or sore on your foot or leg. This information is not intended to replace advice given to you by your health care provider. Make sure you discuss any questions you have with your health care provider. Document Released: 05/08/2014 Document Revised: 01/30/2016 Document Reviewed: 01/30/2016 Elsevier Interactive Patient Education  2018 [employer] Inc.   Ankle Sprain An ankle sprain is a stretch or tear in one of the tough, fiber-like tissues (ligaments) in the ankle. The ligaments in your ankle help to hold the bones of the ankle together. What are the causes? This condition is often caused by stepping on or falling on the outer edge of the foot. What increases the risk? This condition is more likely to develop in people who play sports. What are the signs or symptoms? Symptoms of this condition include:  Pain in your ankle.  Swelling.  Bruising. Bruising may develop right after you sprain your ankle or 1-2 days later.  Trouble standing or walking,  especially when you turn or change directions.  How is this diagnosed? This condition is diagnosed with a physical exam. During the exam, your health care provider will press on certain parts of your foot and ankle and try to move them in certain ways. X-rays may be taken to see how severe the sprain is and to check for broken bones. How is this treated? This condition may be treated with:  A brace. This is used to keep the ankle from moving until it heals.  An elastic bandage. This is used to support the ankle.  Crutches.  Pain medicine.  Surgery. This may be needed if the sprain is severe.  Physical therapy. This may help to improve the range of motion in the ankle.  Follow these instructions at home:  Rest your ankle.  Take over-the-counter and prescription medicines only as told by your health care provider.  For 2-3 days, keep your ankle raised (elevated) above the level of your heart as much as possible.  If directed, apply ice to the area: ? Put ice in a plastic bag. ? Place a towel between your skin and the bag. ? Leave the ice on for 20 minutes, 2-3 times a day.  If you were given a brace: ? Wear it as directed. ? Remove it to shower or bathe. ? Try not to move your ankle much, but wiggle your toes from time to time. This helps to prevent swelling.  If you were given an elastic bandage (dressing): ? Remove it to shower or bathe. ? Try not to move your ankle much, but wiggle your toes from time to time. This helps to prevent swelling. ? Adjust the dressing to make it more comfortable if it feels too tight. ? Loosen the dressing if you have numbness or tingling in your foot, or if your foot becomes cold and blue.  If you have crutches, use them as told by your health care provider. Continue to use them until you can walk without feeling pain in your ankle. Contact a health care provider if:  You have rapidly increasing bruising or swelling.  Your pain is  not  relieved with medicine. Get help right away if:  Your toes or foot becomes numb or blue.  You have severe pain that gets worse. This information is not intended to replace advice given to you by your health care provider. Make sure you discuss any questions you have with your health care provider. Document Released: 12/22/2004 Document Revised: 05/01/2015 Document Reviewed: 07/24/2014 Elsevier Interactive Patient Education  2017 [employer].

## 2016-07-11 NOTE — Progress Notes (Addendum)
By signing my name below, I, Mesha Guinyard, attest that this documentation has been prepared under the direction and in the presence of Norberto SorensonEva Shaw, MD.  Electronically Signed: Arvilla MarketMesha Guinyard, Medical Scribe. 07/11/16. 11:46 AM.  Subjective:    Patient ID: Deanna Park, female    DOB: [birth year], 61 y.o.   MRN: [id number]  HPI Chief Complaint  Patient presents with   Foot Injury    RIGHT x 1 month ago    HPI Comments: Deanna KillRobin Simmons Park is a 61 y.o. female who [facility] complaining of right foot injury onset 1 month ago. Pt stumbled over a box without falling, but the way she caught her foot injured her foot. Pt had it checked out and was told it was a soft tissue injury, but not told were it was injured. Pt was given a stirrup splint shoe that strapped on the side and she was told to ice it but wasn't sure where to ice her foot. Reports associated sxs of  pain in her distal metatarsals, burning on the lateral metatarsals at night. Pt has been rubbing her foot with castor oil without relief of her sxs. She has been wearing compression socks with ease on her toes and hill. She can move her toes without difficulty.  There are no active problems to display for this patient.  No past medical history on file. Past Surgical History:  Procedure Laterality Date   WRIST SURGERY     Allergies  Allergen Reactions   Azithromycin     Caused diarrhea   Red Dye    Sulfa Antibiotics Itching and Rash   Prior to Admission medications   Medication Sig Start Date End Date Taking? Authorizing Provider  naproxen (NAPROSYN) 500 MG tablet Take 1 tablet (500 mg total) by mouth 2 (two) times daily. 06/15/16  Yes Eustace MooreMurray, Laura W, MD   Social History   Social History   Marital status: Legally Separated    Spouse name: N/A   Number of children: N/A   Years of education: N/A   Occupational History   Not on file.   Social History Main Topics   Smoking  status: Never Smoker   Smokeless tobacco: Never Used   Alcohol use No     Comment: occas   Drug use: No   Sexual activity: Not on file   Other Topics Concern   Not on file   Social History Narrative   No narrative on file   Review of Systems  Cardiovascular: Positive for leg swelling.  Musculoskeletal: Positive for arthralgias.  see hpi Objective:  Physical Exam  Constitutional: She appears well-developed and well-nourished. No distress.  HENT:  Head: Normocephalic and atraumatic.  Eyes: Conjunctivae are normal.  Neck: Neck supple.  Cardiovascular: Normal rate.   Pulses:      Dorsalis pedis pulses are 2+ on the right side.       Posterior tibial pulses are 2+ on the right side.  Pulmonary/Chest: Effort normal.  Musculoskeletal: She exhibits edema (3+ pitting edema in foot extending to upper calf).  Ankle pain at the medial aspect with plantar flexing TTP over medial malleolus TTP just anteriorly and superiorly Positive squeeze test at the tibfib No taylor shift No significant pain with metatarsal squeeze 5/5 strength on plantar and dorsal flexion Severe TTP over the distal 3rd and 4th on ventrum on foot  Neurological: She is alert.  Skin: Skin is warm and dry.  Psychiatric: She has a  normal mood and affect. Her behavior is normal.  Nursing note and vitals reviewed.   Vitals:   07/11/16 1138  BP: 124/70  Pulse: 65  Resp: 16  Temp: 99.1 F (37.3 C)  TempSrc: Oral  SpO2: 99%  Weight: 283 lb 3.2 oz (128.5 kg)  Height: 5\' 8"  (1.727 m)   Body mass index is 43.06 kg/m.   Dg Ankle Complete Right  Result Date: 07/11/2016 CLINICAL DATA:  Tripped 1 month ago.  Medial malleolar pain. EXAM: RIGHT ANKLE - COMPLETE 3+ VIEW COMPARISON:  06/15/2016 FINDINGS: No fracture. No subluxation. Lucent lesion in the medial aspect of the talus may be related to osteochondral injury. Soft tissue swelling noted. 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  Ankle MRI would be helpful to further evaluate. Electronically Signed   By: Kennith Center M.D.   On: 07/11/2016 12:26   Dg Ankle Complete Right  Result Date: 06/15/2016 CLINICAL DATA:  Patient states that she began having rt ankle pain after tripping over a box at work yesterday. EXAM: RIGHT ANKLE - COMPLETE 3+ VIEW COMPARISON:  None. FINDINGS: Osseous alignment is normal. No fracture line or displaced fracture fragment seen. Ankle mortise is symmetric. Probable soft tissue swelling. Degenerative spurring noted within the midfoot, incompletely imaged. IMPRESSION: 1. No acute appearing osseous abnormality. No fracture line or displaced fracture fragment. 2. Probable soft tissue swelling. 3. Degenerative spurring in the midfoot, incompletely imaged. Electronically Signed   By: Bary Richard M.D.   On: 06/15/2016 15:59   Dg Foot Complete Right  Result Date: 07/11/2016 CLINICAL DATA:  Injury 1 month ago with persistent pain. EXAM: RIGHT FOOT COMPLETE - 3+ VIEW COMPARISON:  None. FINDINGS: Degenerative changes noted MTP joint great toe. No evidence of fracture. No subluxation or dislocation. Degenerative changes are noted in the tibiotalar joint. IMPRESSION: Degenerative changes without acute bony abnormality. Electronically Signed   By: Kennith Center M.D.   On: 07/11/2016 12:28    Assessment & Plan:   Per UTD: Treatment consists of a non-weightbearing short-leg cast for six weeks, followed by gradual resumption of weight-bearing in a cast boot for two to four weeks, and finally a gradual return to normal activity   1. Right foot injury, initial encounter   2. Right ankle injury, initial encounter   3. Osteochondral lesion of talar dome - injury was 1 mo prior and pt has been using a crutch as a cane and using compression sleeve over ankle but no other rehab and has had continued pain so will refer to podiatry for further eval and management. In the interim, placed pt in a tall cam boot and encouraged her  to use both crutches to only weight-bear as much as tol - pt states feel much better.  Ice qid.    Orders Placed This Encounter  Procedures   DG Ankle Complete Right    Standing Status:   Future    Number of Occurrences:   1    Standing Expiration Date:   07/11/2017    Order Specific Question:   Reason for Exam (SYMPTOM  OR DIAGNOSIS REQUIRED)    Answer:   tripped 1 mo ago, still on crutches and bracing with pain at medial maleous, distal tib fib, distal 3rd/4th metatarsal with soft tissue swelling    Order Specific Question:   Preferred imaging location?    Answer:   External   DG Foot Complete Right    Standing Status:   Future    Number of  Occurrences:   1    Standing Expiration Date:   07/11/2017    Order Specific Question:   Reason for Exam (SYMPTOM  OR DIAGNOSIS REQUIRED)    Answer:   tripped 1 mo ago, still on crutches and bracing with pain at medial maleous, distal tib fib, distal 3rd/4th metatarsal with soft tissue swelling    Order Specific Question:   Preferred imaging location?    Answer:   External   Ambulatory referral to Podiatry    Referral Priority:   Routine    Referral Type:   Consultation    Referral Reason:   Specialty Services Required    Requested Specialty:   Podiatry    Number of Visits Requested:   1    I personally performed the services described in this documentation, which was scribed in my presence. The recorded information has been reviewed and considered, and addended by me as needed.   Norberto Sorenson, M.D.  Primary Care at [facility] [address] [place], [place] [address] [phone] phone [phone] fax  07/13/16 11:48 PM

## 2016-07-27 ENCOUNTER — Ambulatory Visit (INDEPENDENT_AMBULATORY_CARE_PROVIDER_SITE_OTHER): Payer: BLUE CROSS/BLUE SHIELD | Admitting: Physician Assistant

## 2016-07-27 ENCOUNTER — Encounter: Payer: Self-pay | Admitting: Physician Assistant

## 2016-07-27 VITALS — BP 133/81 | HR 57 | Temp 98.2°F | Resp 18 | Ht 67.32 in | Wt 283.0 lb

## 2016-07-27 DIAGNOSIS — Z124 Encounter for screening for malignant neoplasm of cervix: Secondary | ICD-10-CM | POA: Diagnosis not present

## 2016-07-27 DIAGNOSIS — Z1329 Encounter for screening for other suspected endocrine disorder: Secondary | ICD-10-CM | POA: Diagnosis not present

## 2016-07-27 DIAGNOSIS — Z23 Encounter for immunization: Secondary | ICD-10-CM

## 2016-07-27 DIAGNOSIS — Z13228 Encounter for screening for other metabolic disorders: Secondary | ICD-10-CM

## 2016-07-27 DIAGNOSIS — Z130 Encounter for screening for diseases of the blood and blood-forming organs and certain disorders involving the immune mechanism: Secondary | ICD-10-CM

## 2016-07-27 DIAGNOSIS — Z111 Encounter for screening for respiratory tuberculosis: Secondary | ICD-10-CM

## 2016-07-27 DIAGNOSIS — Z0000 Encounter for general adult medical examination without abnormal findings: Secondary | ICD-10-CM

## 2016-07-27 DIAGNOSIS — Z13220 Encounter for screening for lipoid disorders: Secondary | ICD-10-CM

## 2016-07-27 NOTE — Patient Instructions (Addendum)
TB skin test Reading: you need to get it read between Wednesday after 12:10 and before Thursday before 12:10    IF you received an x-ray today, you will receive an invoice from [facility]. Please contact [facility] at [phone] with questions or concerns regarding your invoice.   IF you received labwork today, you will receive an invoice from [facility]. Please contact [facility] at [phone] with questions or concerns regarding your invoice.   Our billing staff will not be able to assist you with questions regarding bills from these companies.  You will be contacted with the lab results as soon as they are available. The fastest way to get your results is to activate your My Chart account. Instructions are located on the last page of this paperwork. If you have not heard from us regarding the results in 2 weeks, please contact this office.    Keeping You Healthy  Get These Tests  Blood Pressure- Have your blood pressure checked by your healthcare provider at least once a year.  Normal blood pressure is 120/80.  Weight- Have your body mass index (BMI) calculated to screen for obesity.  BMI is a measure of body fat based on height and weight.  You can calculate your own BMI at https://www.west-esparza.com/www.nhlbisupport.com/bmi/  Cholesterol- Have your cholesterol checked every year.  Diabetes- Have your blood sugar checked every year if you have high blood pressure, high cholesterol, a family history of diabetes or if you are overweight.  Pap Test - Have a pap test every 1 to 5 years if you have been sexually active.  If you are older than 65 and recent pap tests have been normal you may not need additional pap tests.  In addition, if you have had a hysterectomy  for benign disease additional pap tests are not necessary.  Mammogram-Yearly mammograms are essential for early detection of breast cancer  Screening for Colon Cancer- Colonoscopy starting at age 63. Screening may begin sooner  depending on your family history and other health conditions.  Follow up colonoscopy as directed by your Gastroenterologist.  Screening for Osteoporosis- Screening begins at age 63 with bone density scanning, sooner if you are at higher risk for developing Osteoporosis.  Get these medicines  Calcium with Vitamin D- Your body requires 1200-1500 mg of Calcium a day and [phone] IU of Vitamin D a day.  You can only absorb 500 mg of Calcium at a time therefore Calcium must be taken in 2 or 3 separate doses throughout the day.  Hormones- Hormone therapy has been associated with increased risk for certain cancers and heart disease.  Talk to your healthcare provider about if you need relief from menopausal symptoms.  Aspirin- Ask your healthcare provider about taking Aspirin to prevent Heart Disease and Stroke.  Get these Immuniztions  Flu shot- Every fall  Pneumonia shot- Once after the age of 63; if you are younger ask your healthcare provider if you need a pneumonia shot.  Tetanus- Every ten years.  Zostavax- Once after the age of 63 to prevent shingles.  Take these steps  Don't smoke- Your healthcare provider can help you quit. For tips on how to quit, ask your healthcare provider or go to www.smokefree.gov or call 1-800 QUIT-NOW.  Be physically active- Exercise 5 days a week for a minimum of 30 minutes.  If you are not already physically active, start slow and gradually work up to 30 minutes of moderate physical activity.  Try walking, dancing, bike riding, swimming, etc.  Eat a healthy diet- Eat a variety of healthy foods such as fruits, vegetables, whole grains, low fat milk, low fat cheeses, yogurt, lean meats, chicken, fish, eggs, dried beans, tofu, etc.  For more information go to www.thenutritionsource.org  Dental visit- Brush and floss teeth twice daily; visit your dentist twice a year.  Eye exam- Visit your Optometrist or Ophthalmologist yearly.  Drink alcohol in moderation-  Limit alcohol intake to one drink or less a day.  Never drink and drive.  Depression- Your emotional health is as important as your physical health.  If you're feeling down or losing interest in things you normally enjoy, please talk to your healthcare provider.  Seat Belts- can save your life; always wear one  Smoke/Carbon Monoxide detectors- These detectors need to be installed on the appropriate level of your home.  Replace batteries at least once a year.  Violence- If anyone is threatening or hurting you, please tell your healthcare provider.  Living Will/ Health care power of attorney- Discuss with your healthcare provider and family.

## 2016-07-27 NOTE — Progress Notes (Signed)
PRIMARY CARE AT [place], [place] [address] [phone]  Date:  07/27/2016   Name:  Deanna Park   DOB:  [birth year]   MRN:  [id number]  PCP:  System, Provider Not In    History of Present Illness:  Deanna Park is a 61 y.o. female patient who presents to PCP with  Chief Complaint  Patient presents with  . Annual Exam     DIET: fish, pescatarian.  Caffeine with one cup of tea.  Water intake: 8-10 bottles per day  BM: NORMAL.  No blood or black stool.  No constipatioin or diarrhea.  URINATION: no dysuria, hematuria, or frequency.    SLEEP: sleep is good.  8 hours per night.    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   EtOH: 1 wine rarely.    Tobacco or vaping: none Illicit drug use: none Sexually active: none at this time.    MENSES: almost 2 decades ago.  No vaginal bleeding since.  No abnormal pap smear.  No hx of std.   2 children--vaginal births.   She will also need a tb test for her work.  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  Currently in a boot for a ligament strain possibly.  Followed by podiatry. Wt Readings from Last 3 Encounters:  07/27/16 283 lb (128.4 kg)  07/11/16 283 lb 3.2 oz (128.5 kg)  09/20/13 282 lb 9.6 oz (128.2 kg)     There are no active problems to display for this patient.   No past medical history on file.  Past Surgical History:  Procedure Laterality Date  . WRIST SURGERY      Social History  Substance Use Topics  . Smoking status: Never Smoker  . Smokeless tobacco: Never Used  . Alcohol use No     Comment: occas    Family History  Problem Relation Age of Onset  . Cancer Mother   . Diabetes Father   . Heart disease Father     Allergies  Allergen Reactions  . Azithromycin     Caused diarrhea  . Red Dye Itching and Rash  . Sulfa Antibiotics Itching and Rash    Medication list has been reviewed and updated.  Current Outpatient Prescriptions on File Prior to  Visit  Medication Sig Dispense Refill  . naproxen (NAPROSYN) 500 MG tablet Take 1 tablet (500 mg total) by mouth 2 (two) times daily. (Patient not taking: Reported on 07/27/2016) 14 tablet 0   No current facility-administered medications on file prior to visit.     Review of Systems  Constitutional: Negative for chills and fever.  HENT: Negative for ear discharge, ear pain and sore throat.   Eyes: Negative for blurred vision and double vision.  Respiratory: Negative for cough, shortness of breath and wheezing.   Cardiovascular: Negative for chest pain, palpitations and leg swelling.  Gastrointestinal: Negative for diarrhea, nausea and vomiting.  Genitourinary: Negative for dysuria, frequency and hematuria.  Musculoskeletal: Positive for joint pain (foot pain, followed by podiatry).  Skin: Negative for itching and rash.  Neurological: Negative for dizziness and headaches.   ROS otherwise unremarkable unless listed above.  Physical Examination: BP 133/81   Pulse (!) 57   Temp 98.2 F (36.8 C) (Oral)   Resp 18   Ht 5' 7.32" (1.71 m)   Wt 283 lb (128.4 kg)   SpO2 98%   BMI 43.90 kg/m  Ideal Body Weight: Weight  in (lb) to have BMI = 25: 160.8  Physical Exam  Constitutional: She is oriented to person, place, and time. She appears well-developed and well-nourished. No distress.  HENT:  Head: Normocephalic and atraumatic.  Right Ear: Tympanic membrane, external ear and ear canal normal.  Left Ear: Tympanic membrane, external ear and ear canal normal.  Nose: Right sinus exhibits no maxillary sinus tenderness and no frontal sinus tenderness. Left sinus exhibits no maxillary sinus tenderness and no frontal sinus tenderness.  Mouth/Throat: Oropharynx is clear and moist. No uvula swelling. No oropharyngeal exudate, posterior oropharyngeal edema or posterior oropharyngeal erythema.  Eyes: Pupils are equal, round, and reactive to light. Conjunctivae and EOM are normal.  Neck: Normal range  of motion. Neck supple. No thyromegaly present.  Cardiovascular: Normal rate, regular rhythm, normal heart sounds and intact distal pulses.  Exam reveals no gallop, no distant heart sounds and no friction rub.   No murmur heard. Pulmonary/Chest: Effort normal and breath sounds normal. No respiratory distress. She has no decreased breath sounds. She has no wheezes. She has no rhonchi.  Abdominal: Soft. Bowel sounds are normal. She exhibits no distension and no mass. There is no tenderness.  Musculoskeletal: Normal range of motion. She exhibits no edema or tenderness.  Lymphadenopathy:       Head (right side): No submandibular, no tonsillar, no preauricular and no posterior auricular adenopathy present.       Head (left side): No submandibular, no tonsillar, no preauricular and no posterior auricular adenopathy present.    She has no cervical adenopathy.  Neurological: She is alert and oriented to person, place, and time. No cranial nerve deficit. She exhibits normal muscle tone. Coordination normal.  Skin: Skin is warm and dry. She is not diaphoretic.  Psychiatric: She has a normal mood and affect. Her behavior is normal.     Visual Acuity Screening   Right eye Left eye Both eyes  Without correction:     With correction: 20/13-2 20/10-2 20/10-2    Assessment and Plan: Deanna Park is a 61 y.o. female who is here today for cc of annual physical exam and to complete form. Tb test performed.   Labs pending. Annual physical exam - Plan: TB Skin Test, CBC, CMP14+EGFR, TSH, Lipid panel, Pap IG w/ reflex to HPV when ASC-U  Screening for deficiency anemia - Plan: CBC  Screening for metabolic disorder - Plan: CMP14+EGFR  Screening for thyroid disorder - Plan: TSH  Screening for lipid disorders - Plan: Lipid panel  PPD screening test - Plan: TB Skin Test  Screening for cervical cancer - Plan: Pap IG w/ reflex to HPV when ASC-U  Need for Tdap vaccination - Plan: Tdap vaccine  greater than or equal to 7yo IM  Ivar Drape, PA-C [facility] 7/24/20188:58 AM

## 2016-07-27 NOTE — Progress Notes (Signed)

## 2016-07-28 ENCOUNTER — Ambulatory Visit (INDEPENDENT_AMBULATORY_CARE_PROVIDER_SITE_OTHER): Payer: PRIVATE HEALTH INSURANCE | Admitting: Podiatry

## 2016-07-28 ENCOUNTER — Ambulatory Visit: Payer: PRIVATE HEALTH INSURANCE

## 2016-07-28 ENCOUNTER — Encounter: Payer: Self-pay | Admitting: Podiatry

## 2016-07-28 DIAGNOSIS — M779 Enthesopathy, unspecified: Secondary | ICD-10-CM

## 2016-07-28 LAB — PAP IG W/ RFLX HPV ASCU: PAP Smear Comment: 0

## 2016-07-28 MED ORDER — METHYLPREDNISOLONE 4 MG PO TBPK: ORAL_TABLET | ORAL | 0 refills | Status: DC

## 2016-07-28 NOTE — Progress Notes (Signed)
   Subjective:    Patient ID: Roger Killobin Simmons Blount, female    DOB: [birth year], 61 y.o.   MRN: [id number]  HPI  Chief Complaint  Patient presents with  . Foot Pain    Across top Rt foot close to ankle and swelling onset June 9th, injury    She presents today chief complaint of a painful right foot and ankle she states that back in June she kicked a box and has developed swelling around the ankle due to this injury. She is currently walking with crutches and a cam boot. She was seen by the emergency room stated that there was no fracture.    Review of Systems  All other systems reviewed and are negative.      Objective:   Physical Exam: Vital signs are stable alert and oriented 3. Pulses are palpable. Neurologic sensorium is intact. The tendon reflexes are intact. Muscle strength was 5 over 5 dorsiflexion plantar flexors and inverters everters onto the musculatures intact. Orthopedic evaluation was resolved with the angle full range of motion without crepitus there is mild edema to the right foot. She has tenderness on the ankle around the medial gutter. Radiographs taken today do demonstrate osteoarthritic changes anterior ankle dorsum of the foot. No fractures are identified.        Assessment & Plan:  Osteoarthritis of the ankle with capsulitis.  Plan: I injected the area today after Betadine prep and I will follow-up with her in 1 month. I also placed her on a Medrol Dosepak. Also dispensed a Tri-Lock brace.

## 2016-07-28 NOTE — Progress Notes (Signed)
   Subjective:    Patient ID: Deanna Park, female    DOB: [birth year], 61 y.o.   MRN: [id number]  HPI    Review of Systems  Musculoskeletal: Positive for arthralgias.  All other systems reviewed and are negative.      Objective:   Physical Exam        Assessment & Plan:

## 2016-07-30 ENCOUNTER — Ambulatory Visit: Payer: PRIVATE HEALTH INSURANCE | Admitting: Urgent Care

## 2016-07-30 LAB — TB SKIN TEST
INDURATION: 0 mm
TB SKIN TEST: NEGATIVE

## 2016-07-31 ENCOUNTER — Encounter: Payer: Self-pay | Admitting: Physician Assistant

## 2016-07-31 ENCOUNTER — Ambulatory Visit (INDEPENDENT_AMBULATORY_CARE_PROVIDER_SITE_OTHER): Payer: PRIVATE HEALTH INSURANCE | Admitting: Physician Assistant

## 2016-07-31 VITALS — BP 120/87 | HR 73 | Temp 98.8°F | Resp 16 | Ht 66.0 in | Wt 279.8 lb

## 2016-07-31 DIAGNOSIS — L03115 Cellulitis of right lower limb: Secondary | ICD-10-CM | POA: Diagnosis not present

## 2016-07-31 MED ORDER — CEPHALEXIN 500 MG PO CAPS: 500.0000 mg | ORAL_CAPSULE | Freq: Three times a day (TID) | ORAL | 0 refills | Status: AC

## 2016-07-31 NOTE — Progress Notes (Signed)
   Deanna KillRobin Simmons Park  MRN: [id number] DOB: 29-Apr-1955  PCP: System, Provider Not In  Subjective:  Pt is a 61 year old female who presents to clinic for problem on her right thigh. She was outside yesterday picking up pieces of wood and PVC pipes from a pile which was in tall grass. She felt stinging sensation at upper inner right thigh, then later the same sensation at upper inner left thigh. She was wearing jeans. She went inside and saw five little bumps at the area. Later that night, the area became red. This morning she felt firm area around the bumps with increasing redness.  Denies fever, chills, sloughing of skin, drainage, shob, tachycardia.   UTD tdap  Review of Systems  Constitutional: Negative for chills, diaphoresis, fatigue and fever.  Respiratory: Negative for cough, chest tightness, shortness of breath and wheezing.   Cardiovascular: Negative for chest pain and palpitations.  Musculoskeletal: Negative for neck stiffness.  Skin: Positive for color change and wound.  Neurological: Negative for dizziness, light-headedness and headaches.  Psychiatric/Behavioral: Negative for sleep disturbance.    There are no active problems to display for this patient.   Current Outpatient Prescriptions on File Prior to Visit  Medication Sig Dispense Refill  . methylPREDNISolone (MEDROL DOSEPAK) 4 MG TBPK tablet 6 day dose pack - take as directed (Patient not taking: Reported on 07/31/2016) 21 tablet 0   No current facility-administered medications on file prior to visit.     Allergies  Allergen Reactions  . Azithromycin     Caused diarrhea  . Red Dye Itching and Rash  . Sulfa Antibiotics Itching and Rash     Objective:  BP 120/87   Pulse 73   Temp 98.8 F (37.1 C) (Oral)   Resp 16   Ht 5\' 6"  (1.676 m)   Wt 279 lb 12.8 oz (126.9 kg)   SpO2 100%   BMI 45.16 kg/m   Physical Exam  Constitutional: She is oriented to person, place, and time and well-developed,  well-nourished, and in no distress. No distress.  Cardiovascular: Normal rate, regular rhythm and normal heart sounds.   Neurological: She is alert and oriented to person, place, and time. GCS score is 15.  Skin: Skin is warm, dry and intact. No petechiae noted. There is erythema.  7x7 cm area of erythema with 4x3 cm area of induration proximal medial right thigh. Central punctate lesion noted. No necrosis appreciated. No fluctuance or drainage. Area is warm to touch. No TTP.   Psychiatric: Mood, memory, affect and judgment normal.  Vitals reviewed.     Assessment and Plan :  1. Cellulitis of right lower extremity - cephALEXin (KEFLEX) 500 MG capsule; Take 1 capsule (500 mg total) by mouth 3 (three) times daily.  Dispense: 21 capsule; Refill: 0 - Cellulitis secondary to possible spider bite. There are no systemic symptoms of infection. Circumference marked with surgical pen. Will treat with Keflex. RTC in 3 days for recheck.  Marco CollieWhitney Willet Schleifer, PA-C  Primary Care at [facility] 07/31/2016 12:03 PM

## 2016-07-31 NOTE — Patient Instructions (Addendum)
Come back and see me on Monday to check in.   Wound care and general measures-Initial treatment measures following any spider bite include: ?Clean the bite with mild soap and water. ?Apply cold packs, taking care not to freeze the tissue. ?Maintain the affected body part in an elevated or neutral position (if possible). ?Administer pain medication as needed. Some patients will respond to nonsteroidal antiinflammatory medications, while others may require opioids  Thank you for coming in today. I hope you feel we met your needs.  Feel free to call PCP if you have any questions or further requests.  Please consider signing up for MyChart if you do not already have it, as this is a great way to communicate with me.  Best,  Whitney McVey, PA-C   IF you received an x-ray today, you will receive an invoice from [facility]. Please contact [facility] at [phone] with questions or concerns regarding your invoice.   IF you received labwork today, you will receive an invoice from [facility]. Please contact [facility] at [phone] with questions or concerns regarding your invoice.   Our billing staff will not be able to assist you with questions regarding bills from these companies.  You will be contacted with the lab results as soon as they are available. The fastest way to get your results is to activate your My Chart account. Instructions are located on the last page of this paperwork. If you have not heard from [place] regarding the results in 2 weeks, please contact this office.

## 2016-08-17 ENCOUNTER — Encounter: Payer: Self-pay | Admitting: Urgent Care

## 2016-08-17 ENCOUNTER — Ambulatory Visit (INDEPENDENT_AMBULATORY_CARE_PROVIDER_SITE_OTHER): Payer: PRIVATE HEALTH INSURANCE | Admitting: Urgent Care

## 2016-08-17 VITALS — BP 144/73 | HR 66 | Temp 98.0°F | Resp 18 | Ht 66.0 in | Wt 284.6 lb

## 2016-08-17 DIAGNOSIS — L089 Local infection of the skin and subcutaneous tissue, unspecified: Secondary | ICD-10-CM | POA: Diagnosis not present

## 2016-08-17 DIAGNOSIS — L259 Unspecified contact dermatitis, unspecified cause: Secondary | ICD-10-CM

## 2016-08-17 DIAGNOSIS — L299 Pruritus, unspecified: Secondary | ICD-10-CM

## 2016-08-17 MED ORDER — CEPHALEXIN 500 MG PO CAPS: 500.0000 mg | ORAL_CAPSULE | Freq: Three times a day (TID) | ORAL | 0 refills | Status: DC

## 2016-08-17 MED ORDER — TRIAMCINOLONE ACETONIDE 0.1 % EX CREA: 1.0000 "application " | TOPICAL_CREAM | Freq: Two times a day (BID) | CUTANEOUS | 0 refills | Status: DC

## 2016-08-17 NOTE — Patient Instructions (Addendum)
If you start to get fever, shortness of breath, chest tightness, tongue swelling, lip swelling, please return to our clinic immediately.    IF you received an x-ray today, you will receive an invoice from [facility]. Please contact [facility] at [phone] with questions or concerns regarding your invoice.   IF you received labwork today, you will receive an invoice from [facility]. Please contact [facility] at [phone] with questions or concerns regarding your invoice.   Our billing staff will not be able to assist you with questions regarding bills from these companies.  You will be contacted with the lab results as soon as they are available. The fastest way to get your results is to activate your My Chart account. Instructions are located on the last page of this paperwork. If you have not heard from us regarding the results in 2 weeks, please contact this office.

## 2016-08-17 NOTE — Progress Notes (Signed)
    MRN: [id number] DOB: 1955-04-20  Subjective:   Deanna Park is a 61 y.o. female presenting for chief complaint of Spider bite  Reports suffering a spider bite over her neck last night. She saw the spider walk across her chest thereafter, swiped it away. Admits that her wound feels warm to the touch, itches. Denies fever, tongue swelling, throat closing, chest tightness, shob, n/v, abdominal pain. She was treated for cellulitis of right leg 07/31/2016, symptoms were worse then.   Deanna Park currently has no medications in their medication list. Also is allergic to azithromycin; red dye; and sulfa antibiotics.  Deanna Park  has no past medical history on file. Also  has a past surgical history that includes Wrist surgery.  Objective:   Vitals: BP (!) 144/73   Pulse 66   Temp 98 F (36.7 C) (Oral)   Resp 18   Ht 5\' 6"  (1.676 m)   Wt 284 lb 9.6 oz (129.1 kg)   SpO2 96%   BMI 45.94 kg/m   Physical Exam  Constitutional: She is oriented to person, place, and time. She appears well-developed and well-nourished.  HENT:  Mouth/Throat: Oropharynx is clear and moist.  No facial swelling.  Eyes: No scleral icterus.  Neck:  Anterior neck with ~2cm area of erythema, urticarial type lesion. Area is pruritic, non-tender.  Cardiovascular: Normal rate, regular rhythm and intact distal pulses.  Exam reveals no gallop and no friction rub.   No murmur heard. Pulmonary/Chest: No respiratory distress. She has no wheezes. She has no rales.  Neurological: She is alert and oriented to person, place, and time.  Skin: Skin is warm and dry.   Assessment and Plan :   1. Contact dermatitis, unspecified contact dermatitis type, unspecified trigger 2. Superficial skin infection 3. Itching - I suspect a contact dermatitis likely due to spider patient saw. I counseled on use of topical steroid, use antibiotic to cover for superficial skin infection. Return-to-clinic precautions discussed, patient verbalized  understanding.   Wallis BambergMario Neftaly Inzunza, PA-C Primary Care at [facility] [phone] 08/17/2016  11:27 AM

## 2016-08-27 ENCOUNTER — Ambulatory Visit: Payer: PRIVATE HEALTH INSURANCE | Admitting: Podiatry

## 2016-12-07 ENCOUNTER — Other Ambulatory Visit: Payer: Self-pay

## 2016-12-07 ENCOUNTER — Ambulatory Visit (HOSPITAL_COMMUNITY)
Admission: EM | Admit: 2016-12-07 | Discharge: 2016-12-07 | Disposition: A | Discharge status: Home / Self Care | Payer: BC Managed Care – PPO | Attending: Emergency Medicine | Admitting: Emergency Medicine

## 2016-12-07 ENCOUNTER — Encounter (HOSPITAL_COMMUNITY): Payer: Self-pay | Admitting: Emergency Medicine

## 2016-12-07 DIAGNOSIS — J069 Acute upper respiratory infection, unspecified: Secondary | ICD-10-CM

## 2016-12-07 DIAGNOSIS — J040 Acute laryngitis: Secondary | ICD-10-CM

## 2016-12-07 MED ORDER — AMOXICILLIN-POT CLAVULANATE 875-125 MG PO TABS: 1.0000 | ORAL_TABLET | Freq: Two times a day (BID) | ORAL | 0 refills | Status: AC

## 2016-12-07 MED ORDER — PREDNISONE 20 MG PO TABS: 40.0000 mg | ORAL_TABLET | Freq: Every day | ORAL | 0 refills | Status: AC

## 2016-12-07 MED ORDER — CROMOLYN SODIUM 5.2 MG/ACT NA AERS: 1.0000 | INHALATION_SPRAY | Freq: Four times a day (QID) | NASAL | 12 refills | Status: DC

## 2016-12-07 NOTE — ED Triage Notes (Signed)
Pt c/o cough x1 week, coughing up something yellow sometimes. Pt lost voice.

## 2016-12-07 NOTE — ED Provider Notes (Signed)
[facility]    CSN: [id number] Arrival date & time: 12/07/16  1748     History   Chief Complaint Chief Complaint  Patient presents with  . Cough    HPI Deanna Park is a 61 y.o. female.   Deanna Park presents with complaints of worsening cough and congestion, post nasal drip. Has been ongoing for greater than a week. She states her mucus feels like it thickens and causes a "strangling cough." she feels this worsened with taking zyrtec. Feels she has been wheezing at night. Runny nose still. Without ear pain. Sore throat has improved. Denies gi/gu complaints. Without history of asthma, does not smoke. Pain with cough. Without current shortness of breath. Weak voice.    ROS per HPI.       History reviewed. No pertinent past medical history.  There are no active problems to display for this patient.   Past Surgical History:  Procedure Laterality Date  . WRIST SURGERY      OB History    No data available       Home Medications    Prior to Admission medications   Medication Sig Start Date End Date Taking? Authorizing Provider  amoxicillin-clavulanate (AUGMENTIN) 875-125 MG tablet Take 1 tablet by mouth every 12 (twelve) hours for 7 days. 12/07/16 12/14/16  Georgetta HaberBurky, Natalie B, NP  cromolyn (NASALCROM) 5.2 MG/ACT nasal spray Place 1 spray into both nostrils 4 (four) times daily. 12/07/16   Georgetta HaberBurky, Natalie B, NP  predniSONE (DELTASONE) 20 MG tablet Take 2 tablets (40 mg total) by mouth daily with breakfast for 5 days. 12/07/16 12/12/16  Georgetta HaberBurky, Natalie B, NP    Family History Family History  Problem Relation Age of Onset  . Cancer Mother   . Diabetes Father   . Heart disease Father     Social History Social History   Tobacco Use  . Smoking status: Never Smoker  . Smokeless tobacco: Never Used  Substance Use Topics  . Alcohol use: No    Comment: occas  . Drug use: No     Allergies   Azithromycin; Red dye; and Sulfa antibiotics   Review of  Systems Review of Systems   Physical Exam Triage Vital Signs ED Triage Vitals [12/07/16 1814]  Enc Vitals Group     BP (!) 137/46     Pulse Rate 72     Resp 18     Temp 98.6 F (37 C)     Temp src      SpO2 100 %     Weight      Height      Head Circumference      Peak Flow      Pain Score      Pain Loc      Pain Edu?      Excl. in GC?    No data found.  Updated Vital Signs BP (!) 137/46   Pulse 72   Temp 98.6 F (37 C)   Resp 18   SpO2 100%   Visual Acuity Right Eye Distance:   Left Eye Distance:   Bilateral Distance:    Right Eye Near:   Left Eye Near:    Bilateral Near:     Physical Exam  Constitutional: She is oriented to person, place, and time. She appears well-developed and well-nourished. No distress.  HENT:  Head: Normocephalic and atraumatic.  Right Ear: Tympanic membrane, external ear and ear canal normal.  Left Ear: Tympanic membrane, external ear  and ear canal normal.  Nose: Mucosal edema present.  Mouth/Throat: Uvula is midline, oropharynx is clear and moist and mucous membranes are normal. No tonsillar exudate.  Eyes: Conjunctivae and EOM are normal. Pupils are equal, round, and reactive to light.  Cardiovascular: Normal rate, regular rhythm and normal heart sounds.  Pulmonary/Chest: Effort normal and breath sounds normal.  Strong dry cough noted; hoarse voice noted  Neurological: She is alert and oriented to person, place, and time.  Skin: Skin is warm and dry.     UC Treatments / Results  Labs (all labs ordered are listed, but only abnormal results are displayed) Labs Reviewed - No data to display  EKG  EKG Interpretation None       Radiology No results found.  Procedures Procedures (including critical care time)  Medications Ordered in UC Medications - No data to display   Initial Impression / Assessment and Plan / UC Course  I have reviewed the triage vital signs and the nursing notes.  Pertinent labs & imaging  results that were available during my care of the patient were reviewed by me and considered in my medical decision making (see chart for details).     Complete course of antibiotics and steroids. Use of nasal spray to loosen congestion. Push fluids to ensure adequate hydration and keep secretions thin. Return precautions provided.If symptoms worsen or do not improve in the next week to return to be seen or to follow up with PCP. Patient verbalized understanding and agreeable to plan.    Final Clinical Impressions(s) / UC Diagnoses   Final diagnoses:  Upper respiratory tract infection, unspecified type  Laryngitis    ED Discharge Orders        Ordered    amoxicillin-clavulanate (AUGMENTIN) 875-125 MG tablet  Every 12 hours     12/07/16 1901    predniSONE (DELTASONE) 20 MG tablet  Daily with breakfast     12/07/16 1901    cromolyn (NASALCROM) 5.2 MG/ACT nasal spray  4 times daily     12/07/16 1901       Controlled Substance Prescriptions [place] Controlled Substance Registry consulted? Not Applicable   Georgetta HaberBurky, Natalie B, NP 12/07/16 1905

## 2016-12-07 NOTE — Discharge Instructions (Signed)
Push fluids to ensure adequate hydration and keep secretions thin.  Tylenol and/or ibuprofen as needed for pain or fevers.  Complete course of antibiotics and prednisone to help with cough and congestion symptoms.  If symptoms worsen or do not improve in the next week to return to be seen or to follow up with PCP.

## 2017-04-06 ENCOUNTER — Encounter: Payer: Self-pay | Admitting: Physician Assistant

## 2017-04-10 ENCOUNTER — Ambulatory Visit (INDEPENDENT_AMBULATORY_CARE_PROVIDER_SITE_OTHER): Payer: BC Managed Care – PPO

## 2017-04-10 ENCOUNTER — Ambulatory Visit (HOSPITAL_COMMUNITY)
Admission: EM | Admit: 2017-04-10 | Discharge: 2017-04-10 | Disposition: A | Discharge status: Home / Self Care | Payer: BC Managed Care – PPO | Attending: Internal Medicine | Admitting: Internal Medicine

## 2017-04-10 ENCOUNTER — Other Ambulatory Visit: Payer: Self-pay

## 2017-04-10 ENCOUNTER — Encounter (HOSPITAL_COMMUNITY): Payer: Self-pay | Admitting: Emergency Medicine

## 2017-04-10 DIAGNOSIS — S90211A Contusion of right great toe with damage to nail, initial encounter: Secondary | ICD-10-CM | POA: Diagnosis not present

## 2017-04-10 MED ORDER — IBUPROFEN 600 MG PO TABS: 600.0000 mg | ORAL_TABLET | Freq: Four times a day (QID) | ORAL | 0 refills | Status: DC | PRN

## 2017-04-10 MED ORDER — IBUPROFEN 800 MG PO TABS
800.0000 mg | ORAL_TABLET | Freq: Once | ORAL | Status: AC
  Administered 2017-04-10: 800 mg via ORAL

## 2017-04-10 MED ORDER — IBUPROFEN 800 MG PO TABS
ORAL_TABLET | ORAL | Status: AC
  Filled 2017-04-10: qty 1

## 2017-04-10 NOTE — ED Triage Notes (Signed)
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  Pain and swelling to right great toe

## 2017-04-10 NOTE — ED Provider Notes (Signed)
[facility]    CSN: [id number] Arrival date & time: 04/10/17  1923     History   Chief Complaint Chief Complaint  Patient presents with  . Foot Pain    HPI Pete Merten is a 62 y.o. female.   Imogean presents with complaints of right foot great toe pain after a cd player fell onto her foot while she was working in her storage unit at 1p today. Increased pain this evening. Worse with touching or weight bearing. States she had fractured her second toe in the past but never her great toe. Throbbing. Has not taken any medications for symptoms. Sensation intact. Without contributing medical history.    ROS per HPI.      History reviewed. No pertinent past medical history.  There are no active problems to display for this patient.   Past Surgical History:  Procedure Laterality Date  . WRIST SURGERY      OB History   None      Home Medications    Prior to Admission medications   Medication Sig Start Date End Date Taking? Authorizing Provider  ibuprofen (ADVIL,MOTRIN) 600 MG tablet Take 1 tablet (600 mg total) by mouth every 6 (six) hours as needed. 04/10/17   Georgetta Haber, NP    Family History Family History  Problem Relation Age of Onset  . Cancer Mother   . Diabetes Father   . Heart disease Father     Social History Social History   Tobacco Use  . Smoking status: Never Smoker  . Smokeless tobacco: Never Used  Substance Use Topics  . Alcohol use: No    Comment: occas  . Drug use: No     Allergies   Azithromycin; Red dye; and Sulfa antibiotics   Review of Systems Review of Systems   Physical Exam Triage Vital Signs ED Triage Vitals  Enc Vitals Group     BP 04/10/17 2002 (!) 144/88     Pulse Rate 04/10/17 2002 70     Resp 04/10/17 2002 (!) 22     Temp 04/10/17 2002 98.2 F (36.8 C)     Temp Source 04/10/17 2002 Oral     SpO2 04/10/17 2002 97 %     Weight --      Height --      Head Circumference --      Peak Flow  --      Pain Score 04/10/17 2000 10     Pain Loc --      Pain Edu? --      Excl. in GC? --    No data found.  Updated Vital Signs BP (!) 144/88 (BP Location: Right Arm) Comment (BP Location): regular, forearm  Pulse 70   Temp 98.2 F (36.8 C) (Oral)   Resp (!) 22   SpO2 97%   Visual Acuity Right Eye Distance:   Left Eye Distance:   Bilateral Distance:    Right Eye Near:   Left Eye Near:    Bilateral Near:     Physical Exam  Constitutional: She is oriented to person, place, and time. She appears well-developed and well-nourished. No distress.  Cardiovascular: Normal rate, regular rhythm and normal heart sounds.  Pulmonary/Chest: Effort normal and breath sounds normal.  Musculoskeletal:       Right foot: There is tenderness and bony tenderness. There is normal range of motion, no swelling, normal capillary refill, no crepitus, no deformity and no laceration.  Feet:  Tenderness to right great toe MTP joint; without significant bruising. ROm intact to joint but with pain; cap refill < 2 secon; strong pedal pulse  Neurological: She is alert and oriented to person, place, and time.  Skin: Skin is warm and dry.     UC Treatments / Results  Labs (all labs ordered are listed, but only abnormal results are displayed) Labs Reviewed - No data to display  EKG None Radiology Dg Foot Complete Right  Result Date: 04/10/2017 CLINICAL DATA:  Trauma to the right foot today. EXAM: RIGHT FOOT COMPLETE - 3+ VIEW COMPARISON:  July 11, 2016 FINDINGS: There is no evidence of fracture or dislocation. Degenerative joint changes of right foot are noted. There is plantar calcaneal spur. Soft tissues are unremarkable. IMPRESSION: No acute fracture or dislocation. Electronically Signed   By: Sherian ReinWei-Chen  Lin M.D.   On: 04/10/2017 20:29    Procedures Procedures (including critical care time)  Medications Ordered in UC Medications  ibuprofen (ADVIL,MOTRIN) tablet 800 mg (has no administration  in time range)     Initial Impression / Assessment and Plan / UC Course  I have reviewed the triage vital signs and the nursing notes.  Pertinent labs & imaging results that were available during my care of the patient were reviewed by me and considered in my medical decision making (see chart for details).     Xray without acute fracture. Consistent with contusion at this time. Ibuprofen, ice, elevation for pain control. If symptoms worsen or do not improve in the next 1-2 weeks to return to be seen or to follow up with PCP.  Patient verbalized understanding and agreeable to plan.    Final Clinical Impressions(s) / UC Diagnoses   Final diagnoses:  Contusion of right great toe with damage to nail, initial encounter    ED Discharge Orders        Ordered    ibuprofen (ADVIL,MOTRIN) 600 MG tablet  Every 6 hours PRN     04/10/17 2033       Controlled Substance Prescriptions [place] Controlled Substance Registry consulted? Not Applicable   Georgetta HaberBurky, Natalie B, NP 04/10/17 2037

## 2017-04-10 NOTE — Discharge Instructions (Signed)
Ice, elevation of foot to help with pain. Ibuprofen every 6-8 hours as needed for pain. Supportive shoes and activity as tolerated.

## 2017-06-13 ENCOUNTER — Ambulatory Visit (HOSPITAL_COMMUNITY)
Admission: EM | Admit: 2017-06-13 | Discharge: 2017-06-13 | Disposition: A | Discharge status: Home / Self Care | Payer: BC Managed Care – PPO | Source: Home / Self Care | Attending: Family Medicine | Admitting: Family Medicine

## 2017-06-13 ENCOUNTER — Encounter (HOSPITAL_COMMUNITY): Payer: Self-pay | Admitting: Emergency Medicine

## 2017-06-13 ENCOUNTER — Other Ambulatory Visit: Payer: Self-pay

## 2017-06-13 ENCOUNTER — Encounter (HOSPITAL_BASED_OUTPATIENT_CLINIC_OR_DEPARTMENT_OTHER): Payer: Self-pay | Admitting: Emergency Medicine

## 2017-06-13 ENCOUNTER — Emergency Department (HOSPITAL_BASED_OUTPATIENT_CLINIC_OR_DEPARTMENT_OTHER)
Admission: EM | Admit: 2017-06-13 | Discharge: 2017-06-13 | Disposition: A | Discharge status: Home / Self Care | Payer: BC Managed Care – PPO | Attending: Emergency Medicine | Admitting: Emergency Medicine

## 2017-06-13 ENCOUNTER — Emergency Department (HOSPITAL_BASED_OUTPATIENT_CLINIC_OR_DEPARTMENT_OTHER): Payer: BC Managed Care – PPO

## 2017-06-13 DIAGNOSIS — Y998 Other external cause status: Secondary | ICD-10-CM | POA: Diagnosis not present

## 2017-06-13 DIAGNOSIS — S0990XA Unspecified injury of head, initial encounter: Secondary | ICD-10-CM | POA: Diagnosis present

## 2017-06-13 DIAGNOSIS — Y939 Activity, unspecified: Secondary | ICD-10-CM | POA: Insufficient documentation

## 2017-06-13 DIAGNOSIS — W07XXXA Fall from chair, initial encounter: Secondary | ICD-10-CM | POA: Insufficient documentation

## 2017-06-13 DIAGNOSIS — Y929 Unspecified place or not applicable: Secondary | ICD-10-CM | POA: Insufficient documentation

## 2017-06-13 DIAGNOSIS — W19XXXA Unspecified fall, initial encounter: Secondary | ICD-10-CM

## 2017-06-13 NOTE — ED Notes (Signed)
Patient transported to CT 

## 2017-06-13 NOTE — ED Triage Notes (Signed)
Pt states yesterday she was sitting in a chair and it rolled out from behind her and she fell back and hit her head on cement floor. Denies LOC, denies being on blood thinners. C/o head pain. [facility] and sent here for further evaluation

## 2017-06-13 NOTE — ED Provider Notes (Signed)
[facility]   [id number] 06/13/17 Arrival Time: 1300  SUBJECTIVE:  Roger KillRobin Park Park is a 62 y.o. female who complains of head injury that began yesterday.  Began after falling backwards in a chair and hitting her head on the cement.  Denies pain or HA.  Denies aggravating or alleviating factors. . Denies similar injuries as an adult.  Patient denies fever, chills, nausea, vomiting, chest pain, SOB, abdominal pain, weakness, numbness or tingling, LOC, retrograde amnesia, anterograde amnesia, HA, dizziness, lack of awareness of surroundings, mood or cognitive disturbances, photophobia, or phonophobia.      ROS: As per HPI.  History reviewed. No pertinent past medical history. Past Surgical History:  Procedure Laterality Date  . WRIST SURGERY     Allergies  Allergen Reactions  . Azithromycin     Caused diarrhea  . Red Dye Itching and Rash  . Sulfa Antibiotics Itching and Rash   No current facility-administered medications on file prior to encounter.    Current Outpatient Medications on File Prior to Encounter  Medication Sig Dispense Refill  . ibuprofen (ADVIL,MOTRIN) 600 MG tablet Take 1 tablet (600 mg total) by mouth every 6 (six) hours as needed. 30 tablet 0   Social History   Socioeconomic History  . Marital status: Legally Separated    Spouse name: Not on file  . Number of children: Not on file  . Years of education: Not on file  . Highest education level: Not on file  Occupational History  . Not on file  Social Needs  . Financial resource strain: Not on file  . Food insecurity:    Worry: Not on file    Inability: Not on file  . Transportation needs:    Medical: Not on file    Non-medical: Not on file  Tobacco Use  . Smoking status: Never Smoker  . Smokeless tobacco: Never Used  Substance and Sexual Activity  . Alcohol use: No    Comment: occas  . Drug use: No  . Sexual activity: Not on file  Lifestyle  . Physical activity:    Days per week:  Not on file    Minutes per session: Not on file  . Stress: Not on file  Relationships  . Social connections:    Talks on phone: Not on file    Gets together: Not on file    Attends religious service: Not on file    Active member of club or organization: Not on file    Attends meetings of clubs or organizations: Not on file    Relationship status: Not on file  . Intimate partner violence:    Fear of current or ex partner: Not on file    Emotionally abused: Not on file    Physically abused: Not on file    Forced sexual activity: Not on file  Other Topics Concern  . Not on file  Social History Narrative  . Not on file   Family History  Problem Relation Age of Onset  . Cancer Mother   . Diabetes Father   . Heart disease Father     OBJECTIVE:  Vitals:   06/13/17 1343  BP: (!) 139/46  Pulse: 68  Resp: 18  Temp: 98.1 F (36.7 C)  SpO2: 100%     General appearance: AOx3; no distress HEENT: normocephalic, no obvious bleeding or hematoma, or depressed skull fractures; atraumatic; PERRL; EOMI grossly; EAC clear without otorrhea; TMs pearly gray with visible cone of light; Nose without rhinorrhea; oropharynx  clear, dentition intact Neck: supple with FROM Lungs: clear to auscultation bilaterally Heart: murmur present, patient aware, radial pulses 2+ bilaterally, cap refill <2sec Extremities: moves all extremities normally; no cyanosis or edema; symmetrical with no gross deformities Skin: warm and dry Neurologic: CN 2-12 grossly intact; ambulates without difficulty; Finger to nose without difficulty, RAM without difficulty; strength and sensation intact and symmetrical about the upper and lower extremities Psychological: alert and cooperative; normal mood and affect, No neuropsych impairments (vacant stare, delayed verbal expression, inability to focus, disorientation, slurred or incoherent speech, emotional lability, or obvious memory deficits.    ASSESSMENT & PLAN:  1. Injury  of head, initial encounter   2. Fall, initial encounter     No orders of the defined types were placed in this encounter.  Discussed potential hemorrhage given patient's age.  Patient would like additional imaging done today.   Recommend further evaluation and management at ED Patient aware and in agreement with this plan  Reviewed expectations re: course of current medical issues. Questions answered. Outlined signs and symptoms indicating need for more acute intervention. Patient verbalized understanding. After Visit Summary given.   Deanna Harding, PA-C 06/13/17 1520

## 2017-06-13 NOTE — ED Provider Notes (Signed)
[facility] EMERGENCY DEPARTMENT Provider Note   CSN: [id number] Arrival date & time: 06/13/17  1534     History   Chief Complaint Chief Complaint  Patient presents with  . Head Injury    HPI Deanna Park is a 62 y.o. female.  Patient presents the emergency department after striking her head yesterday.  Patient states that she slipped backwards out of a chair yesterday and struck the back of her head.  There is no loss of consciousness.  She has had minor headache.  No vision change or loss.  No neck pain.  No chest pain, nausea or vomiting.  Patient was seen at urgent care and sent here for imaging.  Patient is not anticoagulated.  No numbness, tingling, or weakness in the arms of the legs.  She is walking without difficulty.      History reviewed. No pertinent past medical history.  There are no active problems to display for this patient.   Past Surgical History:  Procedure Laterality Date  . WRIST SURGERY       OB History   None      Home Medications    Prior to Admission medications   Medication Sig Start Date End Date Taking? Authorizing Provider  ibuprofen (ADVIL,MOTRIN) 600 MG tablet Take 1 tablet (600 mg total) by mouth every 6 (six) hours as needed. 04/10/17   Georgetta Haber, NP    Family History Family History  Problem Relation Age of Onset  . Cancer Mother   . Diabetes Father   . Heart disease Father     Social History Social History   Tobacco Use  . Smoking status: Never Smoker  . Smokeless tobacco: Never Used  Substance Use Topics  . Alcohol use: No    Comment: occas  . Drug use: No     Allergies   Azithromycin; Red dye; and Sulfa antibiotics   Review of Systems Review of Systems  Constitutional: Negative for fatigue.  HENT: Negative for tinnitus.   Eyes: Negative for photophobia, pain and visual disturbance.  Respiratory: Negative for shortness of breath.   Cardiovascular: Negative for chest pain.    Gastrointestinal: Negative for nausea and vomiting.  Musculoskeletal: Negative for back pain, gait problem and neck pain.  Skin: Negative for wound.  Neurological: Positive for headaches. Negative for dizziness, weakness, light-headedness and numbness.  Psychiatric/Behavioral: Negative for confusion and decreased concentration.     Physical Exam Updated Vital Signs BP 140/69 (BP Location: Left Arm)   Pulse 65   Temp 98.2 F (36.8 C) (Oral)   Resp 18   Ht 5\' 8"  (1.727 m)   Wt 128.8 kg (284 lb)   SpO2 100%   BMI 43.18 kg/m   Physical Exam  Constitutional: She is oriented to person, place, and time. She appears well-developed and well-nourished.  HENT:  Head: Normocephalic and atraumatic. Head is without raccoon's eyes and without Battle's sign.  Right Ear: Tympanic membrane, external ear and ear canal normal. No hemotympanum.  Left Ear: Tympanic membrane, external ear and ear canal normal. No hemotympanum.  Nose: Nose normal. No nasal septal hematoma.  Mouth/Throat: Uvula is midline, oropharynx is clear and moist and mucous membranes are normal.  Eyes: Pupils are equal, round, and reactive to light. Conjunctivae, EOM and lids are normal. Right eye exhibits no nystagmus. Left eye exhibits no nystagmus.  No visible hyphema noted  Neck: Normal range of motion. Neck supple.  Full ROM neck.   Cardiovascular: Normal rate  and regular rhythm.  Pulmonary/Chest: Effort normal and breath sounds normal.  Abdominal: Soft. There is no tenderness.  Musculoskeletal:       Cervical back: She exhibits normal range of motion, no tenderness and no bony tenderness.       Thoracic back: She exhibits no tenderness and no bony tenderness.       Lumbar back: She exhibits no tenderness and no bony tenderness.  Neurological: She is alert and oriented to person, place, and time. She has normal strength and normal reflexes. No cranial nerve deficit or sensory deficit. Coordination normal. GCS eye subscore  is 4. GCS verbal subscore is 5. GCS motor subscore is 6.  Skin: Skin is warm and dry.  Psychiatric: She has a normal mood and affect.  Nursing note and vitals reviewed.    ED Treatments / Results  Labs (all labs ordered are listed, but only abnormal results are displayed) Labs Reviewed - No data to display  EKG None  Radiology Ct Head Wo Contrast  Result Date: 06/13/2017 CLINICAL DATA:  Fall out of chair and hit back of head yesterday. Posttraumatic headache. Initial encounter. EXAM: CT HEAD WITHOUT CONTRAST TECHNIQUE: Contiguous axial images were obtained from the base of the skull through the vertex without intravenous contrast. COMPARISON:  None. FINDINGS: Brain: No evidence of acute infarction, hemorrhage, hydrocephalus, extra-axial collection, or mass lesion/mass effect. Vascular:  No hyperdense vessel or other acute findings. Skull: No evidence of fracture or other significant bone abnormality. Sinuses/Orbits:  No acute findings. Other: None. IMPRESSION: Negative noncontrast head CT. Electronically Signed   By: Myles RosenthalJohn  Stahl M.D.   On: 06/13/2017 17:17    Procedures Procedures (including critical care time)  Medications Ordered in ED Medications - No data to display   Initial Impression / Assessment and Plan / ED Course  I have reviewed the triage vital signs and the nursing notes.  Pertinent labs & imaging results that were available during my care of the patient were reviewed by me and considered in my medical decision making (see chart for details).     Patient seen and examined. CT ordered.   Vital signs reviewed and are as follows: BP 140/69 (BP Location: Left Arm)   Pulse 65   Temp 98.2 F (36.8 C) (Oral)   Resp 18   Ht 5\' 8"  (1.727 m)   Wt 128.8 kg (284 lb)   SpO2 100%   BMI 43.18 kg/m   Patient was counseled on head injury precautions and symptoms that should indicate their return to the ED.  These include severe worsening headache, vision changes,  confusion, loss of consciousness, trouble walking, nausea & vomiting, or weakness/tingling in extremities.     Final Clinical Impressions(s) / ED Diagnoses   Final diagnoses:  Injury of head, initial encounter   Patient with minor head injury.  Occurred yesterday.  CT imaging is negative.  No signs of concussion at current time.  [place] to [place] and felt nervous about leaving without getting checked on.  Patient reassured.   ED Discharge Orders    None       Renne CriglerGeiple, Jamarkis Branam, Cordelia Poche-C 06/13/17 1740    Derwood KaplanNanavati, Ankit, MD 06/13/17 [phone]

## 2017-06-13 NOTE — Discharge Instructions (Addendum)
Recommend further evaluation and management at ED Patient aware and in agreement with this plan

## 2017-06-13 NOTE — Discharge Instructions (Signed)
Please read and follow all provided instructions.  Your diagnoses today include:  1. Injury of head, initial encounter     Tests performed today include:  CT scan of your head that did not show any serious injury.  Vital signs. See below for your results today.   Medications prescribed:   None  Take any prescribed medications only as directed.  Home care instructions:  Follow any educational materials contained in this packet.  BE VERY CAREFUL not to take multiple medicines containing Tylenol (also called acetaminophen). Doing so can lead to an overdose which can damage your liver and cause liver failure and possibly death.   Follow-up instructions: Please follow-up with your primary care provider in the next 3 days for further evaluation of your symptoms if you are not feeling better.    Return instructions:  SEEK IMMEDIATE MEDICAL ATTENTION IF:  There is confusion or drowsiness (although children frequently become drowsy after injury).   You cannot awaken the injured person.   You have more than one episode of vomiting.   You notice dizziness or unsteadiness which is getting worse, or inability to walk.   You have convulsions or unconsciousness.   You experience severe, persistent headaches not relieved by Tylenol.  You cannot use arms or legs normally.   There are changes in pupil sizes. (This is the black center in the colored part of the eye)   There is clear or bloody discharge from the nose or ears.   You have change in speech, vision, swallowing, or understanding.   Localized weakness, numbness, tingling, or change in bowel or bladder control.  You have any other emergent concerns.  Additional Information: You have had a head injury which does not appear to require admission at this time.  Your vital signs today were: BP 140/69 (BP Location: Left Arm)    Pulse 65    Temp 98.2 F (36.8 C) (Oral)    Resp 18    Ht 5\' 8"  (1.727 m)    Wt 128.8 kg (284 lb)     SpO2 100%    BMI 43.18 kg/m  If your blood pressure (BP) was elevated above 135/85 this visit, please have this repeated by your doctor within one month. --------------

## 2017-06-13 NOTE — ED Triage Notes (Signed)
Pt states yesterday she was sitting in a chair and it rolled out from behind her and she fell back and hit her head on cement floor. Denies LOC, denies being on blood thinners. C/o head pain.

## 2017-07-23 ENCOUNTER — Ambulatory Visit (INDEPENDENT_AMBULATORY_CARE_PROVIDER_SITE_OTHER): Payer: BC Managed Care – PPO | Admitting: Emergency Medicine

## 2017-07-23 ENCOUNTER — Other Ambulatory Visit: Payer: Self-pay

## 2017-07-23 ENCOUNTER — Encounter: Payer: Self-pay | Admitting: Emergency Medicine

## 2017-07-23 VITALS — BP 116/60 | HR 70 | Temp 98.2°F | Resp 17 | Ht 68.0 in | Wt 295.8 lb

## 2017-07-23 DIAGNOSIS — B999 Unspecified infectious disease: Secondary | ICD-10-CM | POA: Insufficient documentation

## 2017-07-23 DIAGNOSIS — R300 Dysuria: Secondary | ICD-10-CM | POA: Insufficient documentation

## 2017-07-23 LAB — POCT URINALYSIS DIP (MANUAL ENTRY)
BILIRUBIN UA: NEGATIVE mg/dL
Bilirubin, UA: NEGATIVE
Blood, UA: NEGATIVE
GLUCOSE UA: NEGATIVE mg/dL
Leukocytes, UA: NEGATIVE
Nitrite, UA: NEGATIVE
Protein Ur, POC: 30 mg/dL — AB
SPEC GRAV UA: 1.02 (ref 1.010–1.025)
Urobilinogen, UA: 1 E.U./dL
pH, UA: 7.5 (ref 5.0–8.0)

## 2017-07-23 MED ORDER — CIPROFLOXACIN HCL 500 MG PO TABS: 500.0000 mg | ORAL_TABLET | Freq: Two times a day (BID) | ORAL | 0 refills | Status: AC

## 2017-07-23 NOTE — Patient Instructions (Addendum)
   IF you received an x-ray today, you will receive an invoice from [facility]. Please contact [facility] at [phone] with questions or concerns regarding your invoice.   IF you received labwork today, you will receive an invoice from [facility]. Please contact [facility] at [phone] with questions or concerns regarding your invoice.   Our billing staff will not be able to assist you with questions regarding bills from these companies.  You will be contacted with the lab results as soon as they are available. The fastest way to get your results is to activate your My Chart account. Instructions are located on the last page of this paperwork. If you have not heard from us regarding the results in 2 weeks, please contact this office.    Dysuria Dysuria is pain or discomfort while urinating. The pain or discomfort may be felt in the tube that carries urine out of the bladder (urethra) or in the surrounding tissue of the genitals. The pain may also be felt in the groin area, lower abdomen, and lower back. You may have to urinate frequently or have the sudden feeling that you have to urinate (urgency). Dysuria can affect both men and women, but is more common in women. Dysuria can be caused by many different things, including:  Urinary tract infection in women.  Infection of the kidney or bladder.  Kidney stones or bladder stones.  Certain sexually transmitted infections (STIs), such as chlamydia.  Dehydration.  Inflammation of the vagina.  Use of certain medicines.  Use of certain soaps or scented products that cause irritation.  Follow these instructions at home: Watch your dysuria for any changes. The following actions may help to reduce any discomfort you are feeling:  Drink enough fluid to keep your urine clear or pale yellow.  Empty your bladder often. Avoid holding urine for long periods of time.  After a bowel movement or urination, women should  cleanse from front to back, using each tissue only once.  Empty your bladder after sexual intercourse.  Take medicines only as directed by your health care provider.  If you were prescribed an antibiotic medicine, finish it all even if you start to feel better.  Avoid caffeine, tea, and alcohol. They can irritate the bladder and make dysuria worse. In men, alcohol may irritate the prostate.  Keep all follow-up visits as directed by your health care provider. This is important.  If you had any tests done to find the cause of dysuria, it is your responsibility to obtain your test results. Ask the lab or department performing the test when and how you will get your results. Talk with your health care provider if you have any questions about your results.  Contact a health care provider if:  You develop pain in your back or sides.  You have a fever.  You have nausea or vomiting.  You have blood in your urine.  You are not urinating as often as you usually do. Get help right away if:  You pain is severe and not relieved with medicines.  You are unable to hold down any fluids.  You or someone else notices a change in your mental function.  You have a rapid heartbeat at rest.  You have shaking or chills.  You feel extremely weak. This information is not intended to replace advice given to you by your health care provider. Make sure you discuss any questions you have with your health care provider. Document Released: 09/20/2003 Document   Revised: 05/30/2015 Document Reviewed: 08/17/2013 Elsevier Interactive Patient Education  2018 [employer]  

## 2017-07-23 NOTE — Progress Notes (Signed)
Virgina Deakins Park 62 y.o.   Chief Complaint  Patient presents with  . Urinary Tract Infection    possible uti x over weekend ? sunday per pt, really on bad yesterday and urine was cloudy, frequency and unable to hold it without voiding on herself    HISTORY OF PRESENT ILLNESS: This is a 62 y.o. female complaining of urinary frequency with bad odor and cloudiness.  Has urgency and some incontinence.  Denies fever or chills.  Able to eat and drink.  Denies nausea or vomiting.  Denies flank pain.  Denies hematuria.  Denies abdominal pain.  Denies any other significant symptoms.  HPI   Prior to Admission medications   Medication Sig Start Date End Date Taking? Authorizing Provider  ibuprofen (ADVIL,MOTRIN) 600 MG tablet Take 1 tablet (600 mg total) by mouth every 6 (six) hours as needed. 04/10/17   Georgetta Haber, NP    Allergies  Allergen Reactions  . Azithromycin     Caused diarrhea  . Red Dye Itching and Rash  . Sulfa Antibiotics Itching and Rash    There are no active problems to display for this patient.   No past medical history on file.  Past Surgical History:  Procedure Laterality Date  . WRIST SURGERY      Social History   Socioeconomic History  . Marital status: Legally Separated    Spouse name: Not on file  . Number of children: Not on file  . Years of education: Not on file  . Highest education level: Not on file  Occupational History  . Not on file  Social Needs  . Financial resource strain: Not on file  . Food insecurity:    Worry: Not on file    Inability: Not on file  . Transportation needs:    Medical: Not on file    Non-medical: Not on file  Tobacco Use  . Smoking status: Never Smoker  . Smokeless tobacco: Never Used  Substance and Sexual Activity  . Alcohol use: No    Comment: occas  . Drug use: No  . Sexual activity: Not on file  Lifestyle  . Physical activity:    Days per week: Not on file    Minutes per session: Not on file  .  Stress: Not on file  Relationships  . Social connections:    Talks on phone: Not on file    Gets together: Not on file    Attends religious service: Not on file    Active member of club or organization: Not on file    Attends meetings of clubs or organizations: Not on file    Relationship status: Not on file  . Intimate partner violence:    Fear of current or ex partner: Not on file    Emotionally abused: Not on file    Physically abused: Not on file    Forced sexual activity: Not on file  Other Topics Concern  . Not on file  Social History Narrative  . Not on file    Family History  Problem Relation Age of Onset  . Cancer Mother   . Diabetes Father   . Heart disease Father      Review of Systems  Constitutional: Negative.  Negative for chills and fever.  HENT: Negative.  Negative for sore throat.   Eyes: Negative.  Negative for blurred vision and double vision.  Respiratory: Negative.  Negative for cough and shortness of breath.   Cardiovascular: Negative.  Negative for  chest pain and palpitations.  Gastrointestinal: Negative.  Negative for abdominal pain, blood in stool, diarrhea, heartburn, melena, nausea and vomiting.  Genitourinary: Positive for dysuria, frequency and urgency. Negative for flank pain and hematuria.  Skin: Negative.  Negative for rash.  Neurological: Negative.  Negative for dizziness and headaches.  Endo/Heme/Allergies: Negative.   All other systems reviewed and are negative.  Vitals:   07/23/17 1232  BP: 116/60  Pulse: 70  Resp: 17  Temp: 98.2 F (36.8 C)  SpO2: 96%     Physical Exam  Constitutional: She is oriented to person, place, and time. She appears well-developed.  Obese  HENT:  Head: Normocephalic and atraumatic.  Nose: Nose normal.  Mouth/Throat: Oropharynx is clear and moist.  Eyes: Pupils are equal, round, and reactive to light. Conjunctivae and EOM are normal.  Neck: Normal range of motion. Neck supple. No thyromegaly  present.  Cardiovascular: Normal rate, regular rhythm and normal heart sounds.  Pulmonary/Chest: Effort normal and breath sounds normal.  Abdominal: Soft. Bowel sounds are normal.  Lymphadenopathy:    She has no cervical adenopathy.  Neurological: She is alert and oriented to person, place, and time. No sensory deficit. She exhibits normal muscle tone.  Skin: Skin is warm and dry. Capillary refill takes less than 2 seconds. No rash noted.  Psychiatric: She has a normal mood and affect. Her behavior is normal.  Vitals reviewed.   Results for orders placed or performed in visit on 07/23/17 (from the past 24 hour(s))  POCT urinalysis dipstick     Status: Abnormal   Collection Time: 07/23/17 12:42 PM  Result Value Ref Range   Color, UA yellow yellow   Clarity, UA clear clear   Glucose, UA negative negative mg/dL   Bilirubin, UA negative negative   Ketones, POC UA negative negative mg/dL   Spec Grav, UA 1.6101.020 9.6041.010 - 1.025   Blood, UA negative negative   pH, UA 7.5 5.0 - 8.0   Protein Ur, POC =30 (A) negative mg/dL   Urobilinogen, UA 1.0 0.2 or 1.0 E.U./dL   Nitrite, UA Negative Negative   Leukocytes, UA Negative Negative   A total of 25 minutes was spent in the room with the patient, greater than 50% of which was in counseling/coordination of care regarding differential diagnosis, management, medications, and need for follow-up if no better or worse.  ASSESSMENT & PLAN: [name] was seen today for urinary tract infection.  Diagnoses and all orders for this visit:  Dysuria -     POCT urinalysis dipstick -     Urine Culture  Clinical infection -     ciprofloxacin (CIPRO) 500 MG tablet; Take 1 tablet (500 mg total) by mouth 2 (two) times daily for 7 days.      Edwina BarthMiguel Magnus Crescenzo, MD [facility]

## 2017-07-25 LAB — URINE CULTURE

## 2017-07-27 ENCOUNTER — Encounter: Payer: Self-pay | Admitting: *Deleted

## 2017-07-27 ENCOUNTER — Telehealth: Payer: Self-pay | Admitting: *Deleted

## 2017-07-27 NOTE — Telephone Encounter (Signed)
Spoke to patient with lab results of urine culture positive. Advised to finish the antibiotic and return to the office if not better.

## 2017-07-27 NOTE — Progress Notes (Unsigned)
Letter sent.

## 2017-07-28 ENCOUNTER — Telehealth: Payer: Self-pay | Admitting: Emergency Medicine

## 2017-07-28 NOTE — Telephone Encounter (Signed)
Copied from CRM [id number]. Topic: Quick Communication - See Telephone Encounter >> Jul 28, 2017  9:51 AM Lorrine KinMcGee, Aylissa Heinemann B, NT wrote: CRM for notification. See Telephone encounter for: 07/28/17. Patient calling and states that she would like to know if she could get a different antibiotic. States that she does not think the one she is currently on is the right fit for her. States that she has read that it can effect muscles and she moves furniture for work. Please advise. [id number] [place],  - [address] CB#: [phone]

## 2017-07-30 ENCOUNTER — Telehealth: Payer: Self-pay | Admitting: *Deleted

## 2017-07-30 NOTE — Telephone Encounter (Signed)
Left message mobile voice mail to return to office for a follow up because of the problem with medication for UTI.

## 2017-08-03 ENCOUNTER — Encounter: Payer: Self-pay | Admitting: Urgent Care

## 2017-08-03 ENCOUNTER — Ambulatory Visit (INDEPENDENT_AMBULATORY_CARE_PROVIDER_SITE_OTHER): Payer: BC Managed Care – PPO | Admitting: Urgent Care

## 2017-08-03 VITALS — BP 104/67 | HR 77 | Temp 98.8°F | Resp 17 | Ht 68.0 in | Wt 297.0 lb

## 2017-08-03 DIAGNOSIS — B999 Unspecified infectious disease: Secondary | ICD-10-CM

## 2017-08-03 DIAGNOSIS — N3090 Cystitis, unspecified without hematuria: Secondary | ICD-10-CM | POA: Diagnosis not present

## 2017-08-03 DIAGNOSIS — R300 Dysuria: Secondary | ICD-10-CM

## 2017-08-03 LAB — POCT URINALYSIS DIP (MANUAL ENTRY)
BILIRUBIN UA: NEGATIVE mg/dL
Bilirubin, UA: NEGATIVE
Glucose, UA: NEGATIVE mg/dL
Leukocytes, UA: NEGATIVE
Nitrite, UA: NEGATIVE
PH UA: 6 (ref 5.0–8.0)
PROTEIN UA: NEGATIVE mg/dL
RBC UA: NEGATIVE
SPEC GRAV UA: 1.01 (ref 1.010–1.025)
UROBILINOGEN UA: 0.2 U/dL

## 2017-08-03 MED ORDER — CEPHALEXIN 500 MG PO CAPS
500.0000 mg | ORAL_CAPSULE | Freq: Three times a day (TID) | ORAL | 0 refills | Status: DC
Start: 2017-08-03 — End: 2018-11-11

## 2017-08-03 NOTE — Patient Instructions (Addendum)
Cephalexin tablets or capsules What is this medicine? CEPHALEXIN (sef a LEX in) is a cephalosporin antibiotic. It is used to treat certain kinds of bacterial infections It will not work for colds, flu, or other viral infections. This medicine may be used for other purposes; ask your health care provider or pharmacist if you have questions. COMMON BRAND NAME(S): Biocef, Daxbia, Keflex, Keftab What should I tell my health care provider before I take this medicine? They need to know if you have any of these conditions: -kidney disease -stomach or intestine problems, especially colitis -an unusual or allergic reaction to cephalexin, other cephalosporins, penicillins, other antibiotics, medicines, foods, dyes or preservatives -pregnant or trying to get pregnant -breast-feeding How should I use this medicine? Take this medicine by mouth with a full glass of water. Follow the directions on the prescription label. This medicine can be taken with or without food. Take your medicine at regular intervals. Do not take your medicine more often than directed. Take all of your medicine as directed even if you think you are better. Do not skip doses or stop your medicine early. Talk to your pediatrician regarding the use of this medicine in children. While this drug may be prescribed for selected conditions, precautions do apply. Overdosage: If you think you have taken too much of this medicine contact a poison control center or emergency room at once. NOTE: This medicine is only for you. Do not share this medicine with others. What if I miss a dose? If you miss a dose, take it as soon as you can. If it is almost time for your next dose, take only that dose. Do not take double or extra doses. There should be at least 4 to 6 hours between doses. What may interact with this medicine? -probenecid -some other antibiotics This list may not describe all possible interactions. Give your health care provider a list of  all the medicines, herbs, non-prescription drugs, or dietary supplements you use. Also tell them if you smoke, drink alcohol, or use illegal drugs. Some items may interact with your medicine. What should I watch for while using this medicine? Tell your doctor or health care professional if your symptoms do not begin to improve in a few days. Do not treat diarrhea with over the counter products. Contact your doctor if you have diarrhea that lasts more than 2 days or if it is severe and watery. If you have diabetes, you may get a false-positive result for sugar in your urine. Check with your doctor or health care professional. What side effects may I notice from receiving this medicine? Side effects that you should report to your doctor or health care professional as soon as possible: -allergic reactions like skin rash, itching or hives, swelling of the face, lips, or tongue -breathing problems -pain or trouble passing urine -redness, blistering, peeling or loosening of the skin, including inside the mouth -severe or watery diarrhea -unusually weak or tired -yellowing of the eyes, skin Side effects that usually do not require medical attention (report to your doctor or health care professional if they continue or are bothersome): -gas or heartburn -genital or anal irritation -headache -joint or muscle pain -nausea, vomiting This list may not describe all possible side effects. Call your doctor for medical advice about side effects. You may report side effects to FDA at 1-800-FDA-1088. Where should I keep my medicine? Keep out of the reach of children. Store at room temperature between 59 and 86 degrees F (15 and  30 degrees C). Throw away any unused medicine after the expiration date. NOTE: This sheet is a summary. It may not cover all possible information. If you have questions about this medicine, talk to your doctor, pharmacist, or health care provider.  2018 Elsevier/Gold Standard  (2007-03-28 17:09:13)    Urinary Tract Infection, Adult A urinary tract infection (UTI) is an infection of any part of the urinary tract. The urinary tract includes the:  Kidneys.  Ureters.  Bladder.  Urethra.  These organs make, store, and get rid of pee (urine) in the body. Follow these instructions at home:  Take over-the-counter and prescription medicines only as told by your doctor.  If you were prescribed an antibiotic medicine, take it as told by your doctor. Do not stop taking the antibiotic even if you start to feel better.  Avoid the following drinks: ? Alcohol. ? Caffeine. ? Tea. ? Carbonated drinks.  Drink enough fluid to keep your pee clear or pale yellow.  Keep all follow-up visits as told by your doctor. This is important.  Make sure to: ? Empty your bladder often and completely. Do not to hold pee for long periods of time. ? Empty your bladder before and after sex. ? Wipe from front to back after a bowel movement if you are female. Use each tissue one time when you wipe. Contact a doctor if:  You have back pain.  You have a fever.  You feel sick to your stomach (nauseous).  You throw up (vomit).  Your symptoms do not get better after 3 days.  Your symptoms go away and then come back. Get help right away if:  You have very bad back pain.  You have very bad lower belly (abdominal) pain.  You are throwing up and cannot keep down any medicines or water. This information is not intended to replace advice given to you by your health care provider. Make sure you discuss any questions you have with your health care provider. Document Released: 06/10/2007 Document Revised: 05/30/2015 Document Reviewed: 11/12/2014 Elsevier Interactive Patient Education  2018 [employer]     IF you received an x-ray today, you will receive an invoice from [facility]. Please contact [facility] at [phone] with questions or concerns  regarding your invoice.   IF you received labwork today, you will receive an invoice from [facility]. Please contact [facility] at [phone] with questions or concerns regarding your invoice.   Our billing staff will not be able to assist you with questions regarding bills from these companies.  You will be contacted with the lab results as soon as they are available. The fastest way to get your results is to activate your My Chart account. Instructions are located on the last page of this paperwork. If you have not heard from us regarding the results in 2 weeks, please contact this office.

## 2017-08-03 NOTE — Progress Notes (Signed)
   MRN: [id number] DOB: 1955-04-15  Subjective:   Deanna Park is a 62 y.o. female presenting for follow-up regarding cystitis.  At her last office visit on 07/23/2017, patient had a clinical diagnosis of an UTI.  Urine culture was positive for beta-hemolytic Streptococcus infection.  Patient was prescribed ciprofloxacin empirically, states that she took 1 dose and decided to stop it after reading about the side effects.  Today, she reports that she would like to get a different antibiotic for her UTI.  Denies fever, dysuria, urinary frequency, hematuria.  [name] has a current medication list which includes the following prescription(s): ibuprofen. Patient is allergic to azithromycin; red dye; and sulfa antibiotics. [name] denies past medical history. Also  has a past surgical history that includes Wrist surgery.  Objective:   Vitals: BP 104/67   Pulse 77   Temp 98.8 F (37.1 C) (Oral)   Resp 17   Ht 5\' 8"  (1.727 m)   Wt 297 lb (134.7 kg)   SpO2 98%   BMI 45.16 kg/m   Physical Exam  Constitutional: She is oriented to person, place, and time. She appears well-developed and well-nourished.  Cardiovascular: Normal rate.  Pulmonary/Chest: Effort normal.  Neurological: She is alert and oriented to person, place, and time.   Results for orders placed or performed in visit on 08/03/17 (from the past 24 hour(s))  POCT urinalysis dipstick     Status: None   Collection Time: 08/03/17 11:29 AM  Result Value Ref Range   Color, UA yellow yellow   Clarity, UA clear clear   Glucose, UA negative negative mg/dL   Bilirubin, UA negative negative   Ketones, POC UA negative negative mg/dL   Spec Grav, UA 4.0981.010 1.1911.010 - 1.025   Blood, UA negative negative   pH, UA 6.0 5.0 - 8.0   Protein Ur, POC negative negative mg/dL   Urobilinogen, UA 0.2 0.2 or 1.0 E.U./dL   Nitrite, UA Negative Negative   Leukocytes, UA Negative Negative   Assessment and Plan :   Cystitis  Dysuria - Plan: POCT  urinalysis dipstick, CANCELED: Urine Culture  Clinical infection - Plan: POCT urinalysis dipstick, CANCELED: Urine Culture  Counseled patient on potential for cross-reactivity given her red dye allergy.  She was agreeable to start Keflex.  We are basing this off of the last urine culture results.  We did not do an additional urine culture given that she did not take the ciprofloxacin.  Wallis BambergMario Keyden Pavlov, PA-C [facility] [phone] 08/03/2017 11:20 AM

## 2017-08-04 NOTE — Telephone Encounter (Signed)
Pt seen on yesterday 08/03/17 by Wallis BambergMario Mani and placed on keflex. Dgaddy,, CMA

## 2018-11-11 ENCOUNTER — Encounter: Payer: Self-pay | Admitting: Registered Nurse

## 2018-11-11 ENCOUNTER — Ambulatory Visit: Payer: BC Managed Care – PPO | Admitting: Registered Nurse

## 2018-11-11 ENCOUNTER — Other Ambulatory Visit: Payer: Self-pay

## 2018-11-11 VITALS — BP 136/84 | HR 67 | Temp 98.4°F | Resp 16 | Wt 302.0 lb

## 2018-11-11 DIAGNOSIS — M549 Dorsalgia, unspecified: Secondary | ICD-10-CM | POA: Diagnosis not present

## 2018-11-11 DIAGNOSIS — G8929 Other chronic pain: Secondary | ICD-10-CM | POA: Diagnosis not present

## 2018-11-11 LAB — POCT URINALYSIS DIP (MANUAL ENTRY)
Bilirubin, UA: NEGATIVE
Glucose, UA: NEGATIVE mg/dL
Ketones, POC UA: NEGATIVE mg/dL
Leukocytes, UA: NEGATIVE
Nitrite, UA: NEGATIVE
Protein Ur, POC: NEGATIVE mg/dL
Spec Grav, UA: 1.02 (ref 1.010–1.025)
Urobilinogen, UA: 0.2 E.U./dL
pH, UA: 7 (ref 5.0–8.0)

## 2018-11-11 MED ORDER — NITROFURANTOIN MONOHYD MACRO 100 MG PO CAPS
100.0000 mg | ORAL_CAPSULE | Freq: Two times a day (BID) | ORAL | 0 refills | Status: AC
Start: 2018-11-11 — End: 2018-11-16

## 2018-11-11 MED ORDER — MELOXICAM 7.5 MG PO TABS: 7.5000 mg | ORAL_TABLET | Freq: Every day | ORAL | 0 refills | Status: DC

## 2018-11-11 MED ORDER — METHOCARBAMOL 500 MG PO TABS: 500.0000 mg | ORAL_TABLET | Freq: Two times a day (BID) | ORAL | 0 refills | Status: DC

## 2018-11-11 NOTE — Progress Notes (Signed)
Established Patient Office Visit  Subjective:  Patient ID: Deanna Park, female    DOB: [birth year]  Age: 63 y.o. MRN: [id number]  CC:  Chief Complaint  Patient presents with  . Back Pain    x 3 days     HPI Deanna Park presents for back and flank pain. Notes that she has some discomfort when voiding. Also notes that she may have strained her lower back when moving wood about 1 week ago. In either case, symptoms have persisted around 3-4 days. R flank, lower back, and some radiation down legs. No fever, fatigue, nvd, chills. No gross hematuria. No discharge, itching, odor.   History reviewed. No pertinent past medical history.  Past Surgical History:  Procedure Laterality Date  . WRIST SURGERY      Family History  Problem Relation Age of Onset  . Cancer Mother   . Diabetes Father   . Heart disease Father     Social History   Socioeconomic History  . Marital status: Legally Separated    Spouse name: Not on file  . Number of children: Not on file  . Years of education: Not on file  . Highest education level: Not on file  Occupational History  . Not on file  Social Needs  . Financial resource strain: Not on file  . Food insecurity    Worry: Not on file    Inability: Not on file  . Transportation needs    Medical: Not on file    Non-medical: Not on file  Tobacco Use  . Smoking status: Never Smoker  . Smokeless tobacco: Never Used  Substance and Sexual Activity  . Alcohol use: No    Comment: occas  . Drug use: No  . Sexual activity: Not on file  Lifestyle  . Physical activity    Days per week: Not on file    Minutes per session: Not on file  . Stress: Not on file  Relationships  . Social Musicianconnections    Talks on phone: Not on file    Gets together: Not on file    Attends religious service: Not on file    Active member of club or organization: Not on file    Attends meetings of clubs or organizations: Not on file    Relationship status:  Not on file  . Intimate partner violence    Fear of current or ex partner: Not on file    Emotionally abused: Not on file    Physically abused: Not on file    Forced sexual activity: Not on file  Other Topics Concern  . Not on file  Social History Narrative  . Not on file    Outpatient Medications Prior to Visit  Medication Sig Dispense Refill  . ibuprofen (ADVIL,MOTRIN) 600 MG tablet Take 1 tablet (600 mg total) by mouth every 6 (six) hours as needed. 30 tablet 0  . cephALEXin (KEFLEX) 500 MG capsule Take 1 capsule (500 mg total) by mouth 3 (three) times daily. 21 capsule 0   No facility-administered medications prior to visit.     Allergies  Allergen Reactions  . Azithromycin     Caused diarrhea  . Red Dye Itching and Rash  . Sulfa Antibiotics Itching and Rash    ROS Review of Systems Per hpi   Objective:    Physical Exam  Constitutional: She is oriented to person, place, and time. She appears well-developed and well-nourished. No distress.  Cardiovascular: Normal rate and regular  rhythm.  Pulmonary/Chest: Effort normal. No respiratory distress.  Musculoskeletal: Normal range of motion.        General: Tenderness (Lower R back, R flank) present. No deformity or edema.  Neurological: She is alert and oriented to person, place, and time.  Skin: Skin is warm and dry. No rash noted. She is not diaphoretic. No erythema. No pallor.  Psychiatric: She has a normal mood and affect. Her behavior is normal. Judgment and thought content normal.  Nursing note and vitals reviewed.   BP 136/84   Pulse 67   Temp 98.4 F (36.9 C) (Oral)   Resp 16   Wt (!) 302 lb (137 kg)   SpO2 97%   BMI 45.92 kg/m  Wt Readings from Last 3 Encounters:  11/11/18 (!) 302 lb (137 kg)  08/03/17 297 lb (134.7 kg)  07/23/17 295 lb 12.8 oz (134.2 kg)     Health Maintenance Due  Topic Date Due  . Hepatitis C Screening  13-Oct-1955  . HIV Screening  08/15/1968  . MAMMOGRAM  08/16/2003  .  COLONOSCOPY  08/16/2003  . DEXA SCAN  08/16/2018  . PNA vac Low Risk Adult (1 of 2 - PCV13) 08/16/2018    There are no preventive care reminders to display for this patient.  No results found for: TSH Lab Results  Component Value Date   WBC 9.9 08/10/2009   HGB 11.2 (L) 08/10/2009   HCT 32.4 (L) 08/10/2009   MCV 81.0 08/10/2009   PLT 232 08/10/2009   Lab Results  Component Value Date   NA 143 08/10/2009   K 3.4 (L) 08/10/2009   CO2 26 08/10/2009   GLUCOSE 111 (H) 08/10/2009   BUN 5 (L) 08/10/2009   CREATININE 0.75 08/10/2009   BILITOT 0.6 08/10/2009   ALKPHOS 69 08/10/2009   AST 63 (H) 08/10/2009   ALT 32 08/10/2009   PROT 6.4 08/10/2009   ALBUMIN 3.5 08/10/2009   CALCIUM 8.9 08/10/2009   No results found for: CHOL No results found for: HDL No results found for: LDLCALC No results found for: TRIG No results found for: CHOLHDL No results found for: NLGX2J    Assessment & Plan:   Problem List Items Addressed This Visit    None    Visit Diagnoses    Chronic right-sided back pain, unspecified back location    -  Primary   Relevant Medications   nitrofurantoin, macrocrystal-monohydrate, (MACROBID) 100 MG capsule   meloxicam (MOBIC) 7.5 MG tablet   methocarbamol (ROBAXIN) 500 MG tablet   Other Relevant Orders   POCT urinalysis dipstick (Completed)      Meds ordered this encounter  Medications  . nitrofurantoin, macrocrystal-monohydrate, (MACROBID) 100 MG capsule    Sig: Take 1 capsule (100 mg total) by mouth 2 (two) times daily for 5 days.    Dispense:  10 capsule    Refill:  0    Order Specific Question:   Supervising Provider    Answer:   Collie Siad A [id number]  . meloxicam (MOBIC) 7.5 MG tablet    Sig: Take 1 tablet (7.5 mg total) by mouth daily.    Dispense:  30 tablet    Refill:  0    Order Specific Question:   Supervising Provider    Answer:   Collie Siad A [id number]  . methocarbamol (ROBAXIN) 500 MG tablet    Sig: Take 1 tablet (500 mg  total) by mouth 2 (two) times daily.    Dispense:  30 tablet  Refill:  0    Order Specific Question:   Supervising Provider    Answer:   Forrest Moron [id number]    Follow-up: No follow-ups on file.   PLAN  ua dip shows RBCs - will treat as if UTI is occurring. Limited options d/t patient's allergies, but feel she will be able to tolerate macrobid.  Will also treat as potential muscle strain, in particular given hx of pain radiating down legs. Concern for sciatica. She demonstrates understanding of flexibility in back and stretches she does regularly. Meloxicam and methocarbamol  Will return PRN if pain worsens or fails to resolve  Patient encouraged to call clinic with any questions, comments, or concerns.   Maximiano Coss, NP

## 2018-11-11 NOTE — Patient Instructions (Signed)
° ° ° °  If you have lab work done today you will be contacted with your lab results within the next 2 weeks.  If you have not heard from us then please contact us. The fastest way to get your results is to register for My Chart. ° ° °IF you received an x-ray today, you will receive an invoice from [facility]. Please contact [facility] at [phone] with questions or concerns regarding your invoice.  ° °IF you received labwork today, you will receive an invoice from [facility]. Please contact [facility] at [phone] with questions or concerns regarding your invoice.  ° °Our billing staff will not be able to assist you with questions regarding bills from these companies. ° °You will be contacted with the lab results as soon as they are available. The fastest way to get your results is to activate your My Chart account. Instructions are located on the last page of this paperwork. If you have not heard from us regarding the results in 2 weeks, please contact this office. °  ° ° ° °

## 2018-12-24 ENCOUNTER — Other Ambulatory Visit: Payer: Self-pay | Admitting: Registered Nurse

## 2018-12-24 DIAGNOSIS — G8929 Other chronic pain: Secondary | ICD-10-CM

## 2019-01-07 IMAGING — DX DG ANKLE COMPLETE 3+V*R*
3 series · 3 of 3 positions shown · non-contrast
Comparison: 06/15/2016

CLINICAL DATA: Tripped 1 month ago.  Medial malleolar pain.

EXAM:
RIGHT ANKLE - COMPLETE 3+ VIEW

[ankle ap]
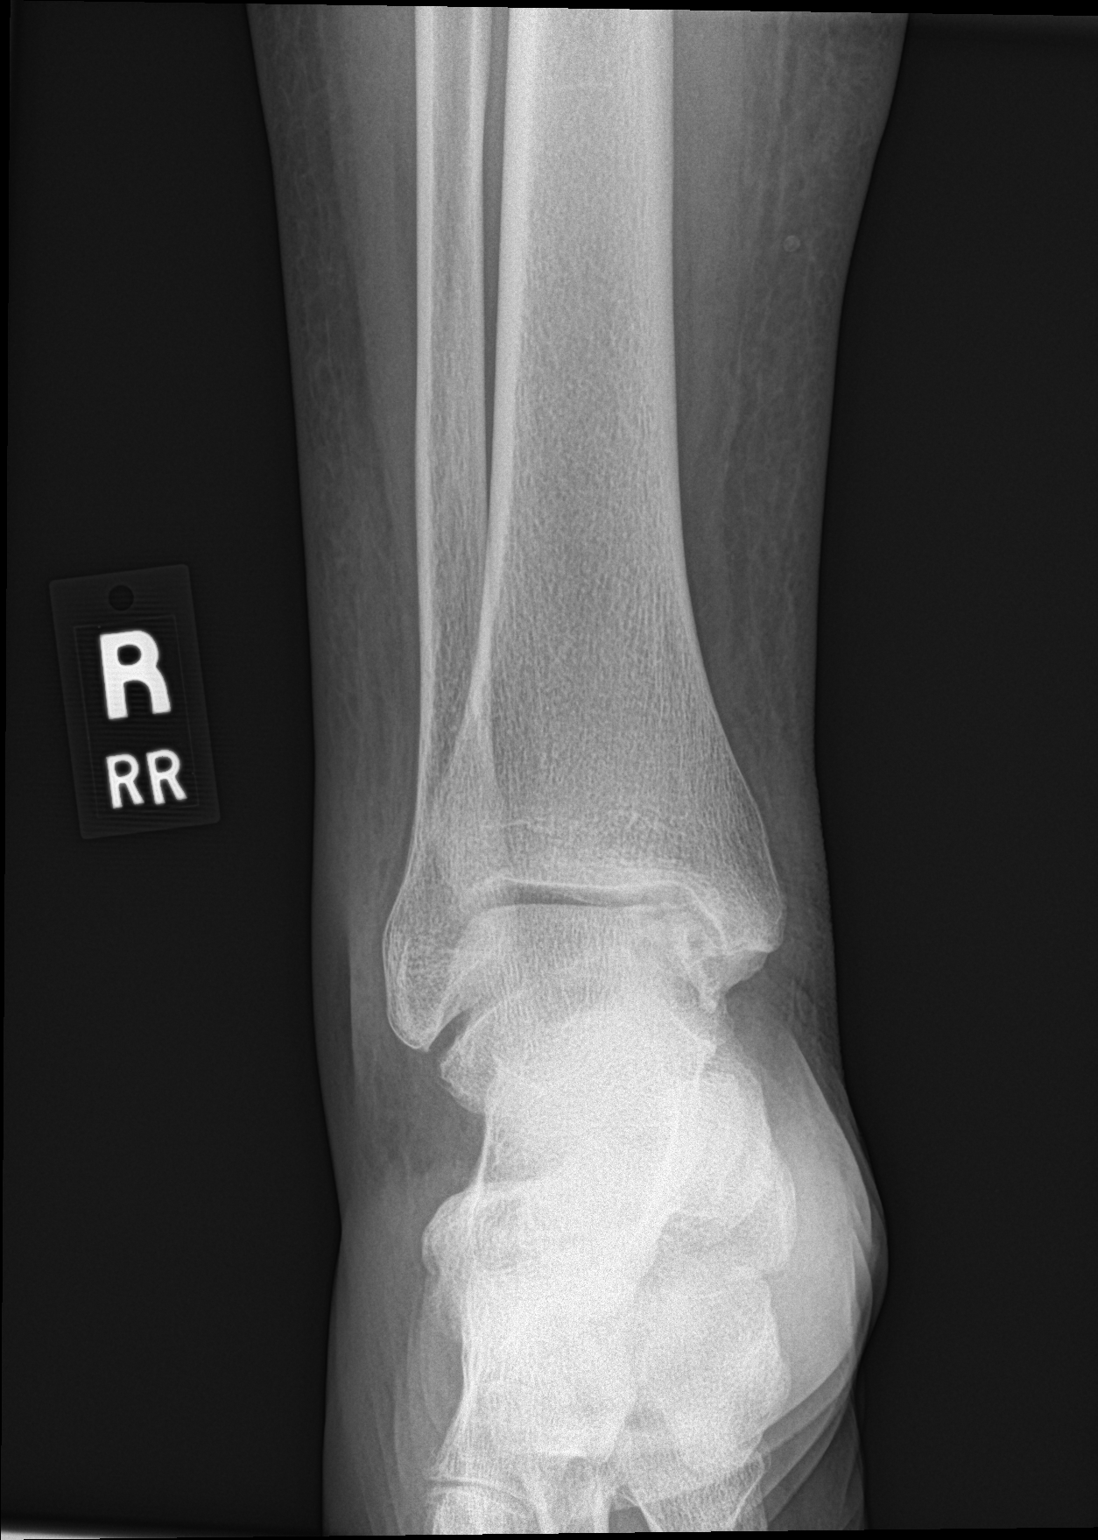

[ankle obl]
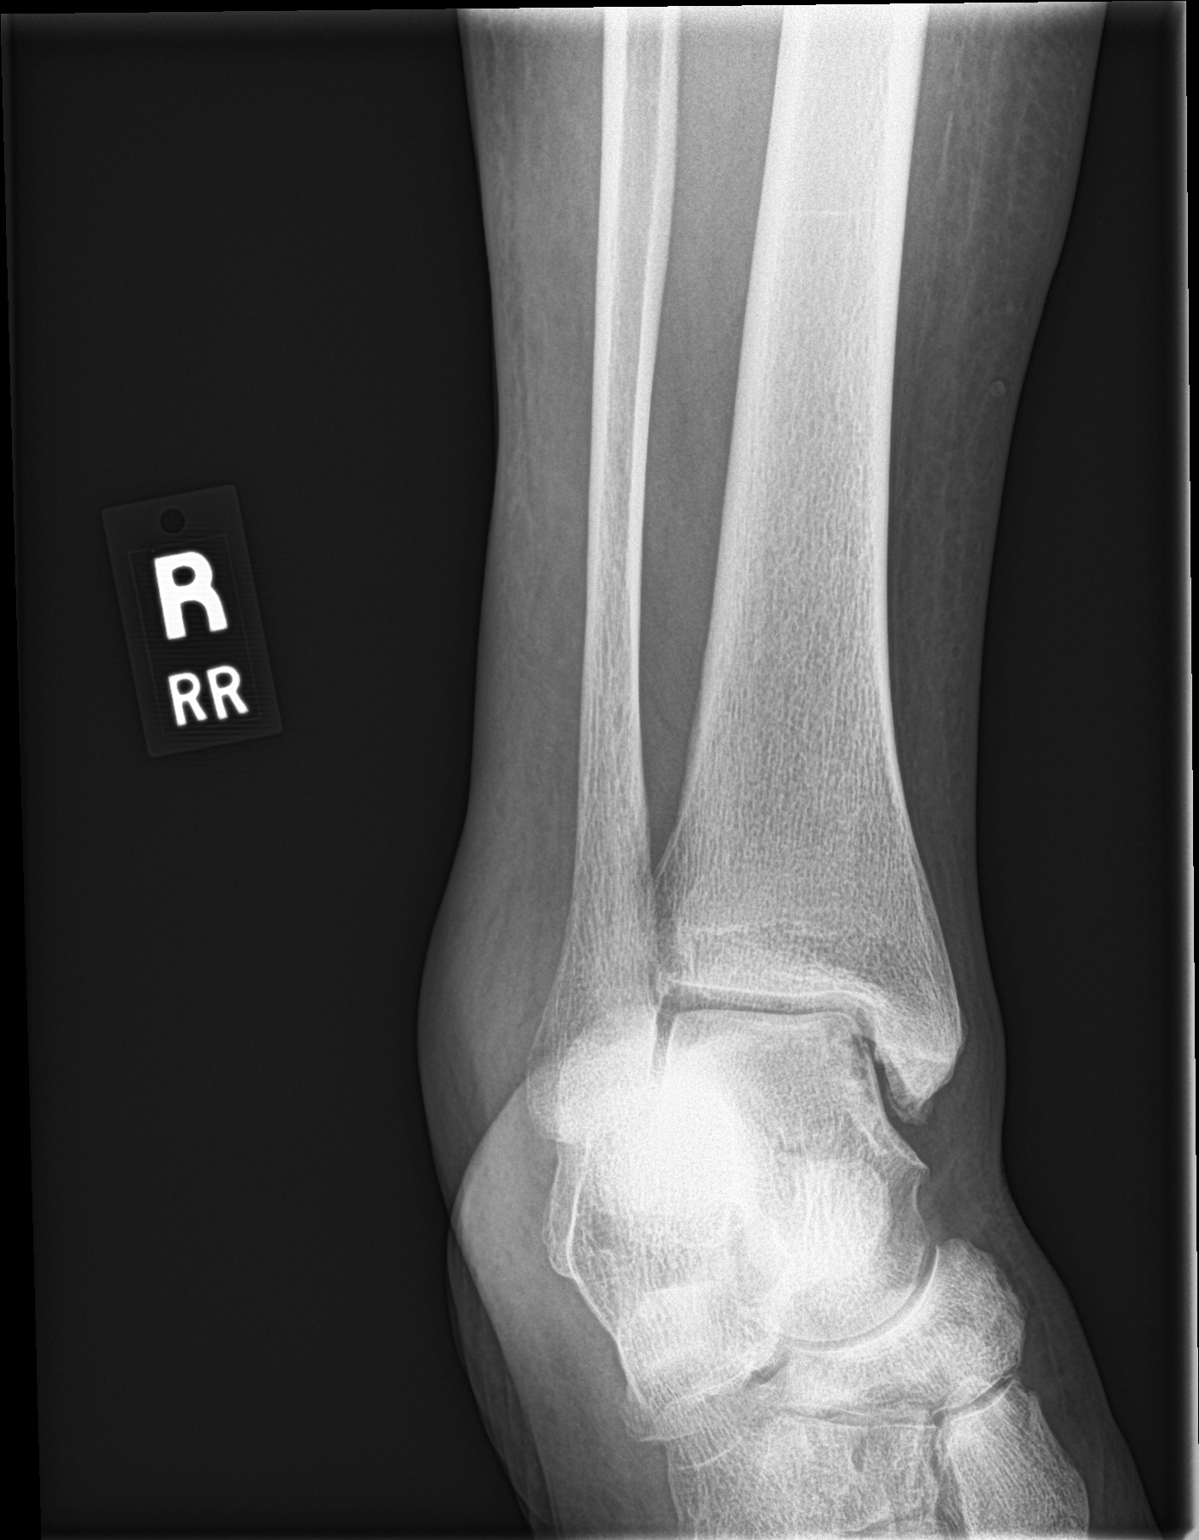

[ankle lat]
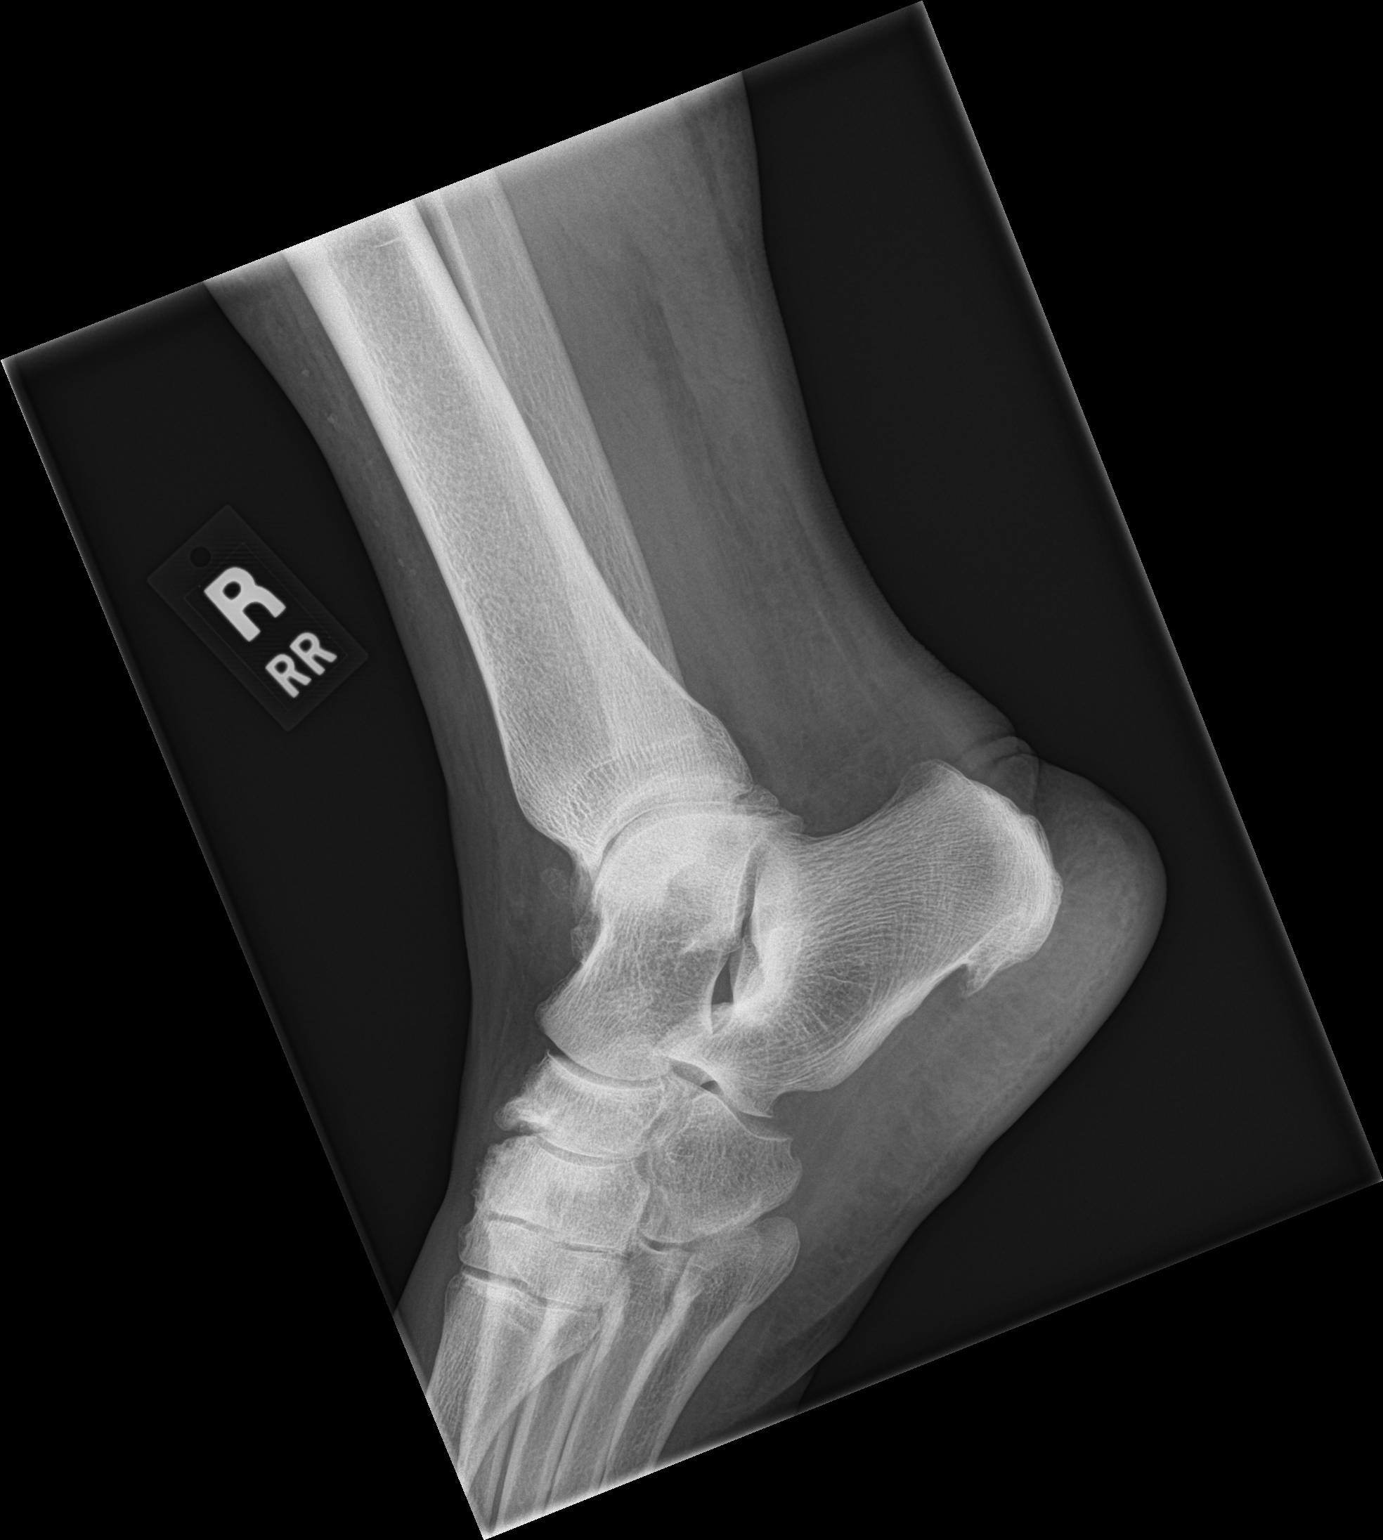

[3 of 3 positions shown; findings below may reference images not displayed]

FINDINGS: No fracture. No subluxation. Lucent lesion in the medial aspect of
the talus may be related to osteochondral injury. Soft tissue
swelling noted.
IMPRESSION: Probable osteochondral injury/defect medial talar dome. Ankle MRI
would be helpful to further evaluate.

## 2019-07-12 ENCOUNTER — Other Ambulatory Visit: Payer: Self-pay

## 2019-07-12 ENCOUNTER — Encounter: Payer: Self-pay | Admitting: Emergency Medicine

## 2019-07-12 ENCOUNTER — Ambulatory Visit (INDEPENDENT_AMBULATORY_CARE_PROVIDER_SITE_OTHER): Payer: BC Managed Care – PPO

## 2019-07-12 ENCOUNTER — Ambulatory Visit: Payer: BC Managed Care – PPO | Admitting: Emergency Medicine

## 2019-07-12 VITALS — BP 129/81 | HR 70 | Temp 98.0°F | Ht 67.0 in | Wt 315.6 lb

## 2019-07-12 DIAGNOSIS — R29818 Other symptoms and signs involving the nervous system: Secondary | ICD-10-CM | POA: Diagnosis not present

## 2019-07-12 DIAGNOSIS — Z889 Allergy status to unspecified drugs, medicaments and biological substances status: Secondary | ICD-10-CM

## 2019-07-12 DIAGNOSIS — M25561 Pain in right knee: Secondary | ICD-10-CM | POA: Diagnosis not present

## 2019-07-12 DIAGNOSIS — Z6841 Body Mass Index (BMI) 40.0 and over, adult: Secondary | ICD-10-CM | POA: Diagnosis not present

## 2019-07-12 DIAGNOSIS — R062 Wheezing: Secondary | ICD-10-CM

## 2019-07-12 DIAGNOSIS — Z9189 Other specified personal risk factors, not elsewhere classified: Secondary | ICD-10-CM

## 2019-07-12 MED ORDER — ALBUTEROL SULFATE HFA 108 (90 BASE) MCG/ACT IN AERS: 2.0000 | INHALATION_SPRAY | Freq: Two times a day (BID) | RESPIRATORY_TRACT | 3 refills | Status: DC

## 2019-07-12 MED ORDER — PREDNISONE 20 MG PO TABS: 40.0000 mg | ORAL_TABLET | Freq: Every day | ORAL | 0 refills | Status: AC

## 2019-07-12 NOTE — Progress Notes (Signed)
Deanna Park 64 y.o.   Chief Complaint  Patient presents with  . right knee pain    due to exercise and hurt herself. last week   . wheezing at night    possible sleep apena    HISTORY OF PRESENT ILLNESS: This is a 64 y.o. female complaining of right knee pain that started last Friday.  Denies any direct injury.  Constant sharp pain worse with walking and no associated symptoms. Also complaining of intermittent wheezing for the past week.  No history of COPD or asthma.  History of allergies.  Requesting referral to allergist. Daughter concerned about possible sleep apnea as she has witnessed snoring and apnea events.  HPI   Prior to Admission medications   Medication Sig Start Date End Date Taking? Authorizing Provider  aspirin EC 325 MG tablet Take 325 mg by mouth daily.   Yes [provider]    Allergies  Allergen Reactions  . Azithromycin     Caused diarrhea  . Red Dye Itching and Rash  . Sulfa Antibiotics Itching and Rash    There are no problems to display for this patient.   History reviewed. No pertinent past medical history.  Past Surgical History:  Procedure Laterality Date  . WRIST SURGERY      Social History   Socioeconomic History  . Marital status: Legally Separated    Spouse name: Not on file  . Number of children: Not on file  . Years of education: Not on file  . Highest education level: Not on file  Occupational History  . Not on file  Tobacco Use  . Smoking status: Never Smoker  . Smokeless tobacco: Never Used  Substance and Sexual Activity  . Alcohol use: No    Comment: occas  . Drug use: No  . Sexual activity: Not on file  Other Topics Concern  . Not on file  Social History Narrative  . Not on file   Social Determinants of Health   Financial Resource Strain:   . Difficulty of Paying Living Expenses:   Food Insecurity:   . Worried About Programme researcher, broadcasting/film/video in the Last Year:   . Barista in the Last Year:    Transportation Needs:   . Freight forwarder (Medical):   Marland Kitchen Lack of Transportation (Non-Medical):   Physical Activity:   . Days of Exercise per Week:   . Minutes of Exercise per Session:   Stress:   . Feeling of Stress :   Social Connections:   . Frequency of Communication with Friends and Family:   . Frequency of Social Gatherings with Friends and Family:   . Attends Religious Services:   . Active Member of Clubs or Organizations:   . Attends Banker Meetings:   Marland Kitchen Marital Status:   Intimate Partner Violence:   . Fear of Current or Ex-Partner:   . Emotionally Abused:   Marland Kitchen Physically Abused:   . Sexually Abused:     Family History  Problem Relation Age of Onset  . Cancer Mother   . Diabetes Father   . Heart disease Father      Review of Systems  Constitutional: Negative.  Negative for chills and fever.  HENT: Negative.  Negative for congestion and sore throat.   Respiratory: Positive for wheezing.   Cardiovascular: Negative.  Negative for chest pain and palpitations.  Gastrointestinal: Negative.  Negative for abdominal pain, diarrhea, nausea and vomiting.  Genitourinary: Negative.  Negative  for dysuria and hematuria.  Musculoskeletal: Positive for joint pain (Right knee).  Skin: Negative.  Negative for rash.  Neurological: Negative.  Negative for dizziness and headaches.  Endo/Heme/Allergies: Positive for environmental allergies.  All other systems reviewed and are negative.  Today's Vitals   07/12/19 0923  BP: 129/81  Pulse: 70  Temp: 98 F (36.7 C)  TempSrc: Temporal  SpO2: 99%  Weight: (!) 315 lb 9.6 oz (143.2 kg)  Height:  (1.702 m)   Body mass index is 49.43 kg/m.   Physical Exam Vitals reviewed.  Constitutional:      Appearance: She is obese.  HENT:     Head: Normocephalic.  Eyes:     Extraocular Movements: Extraocular movements intact.     Pupils: Pupils are equal, round, and reactive to light.  Cardiovascular:     Rate  and Rhythm: Normal rate and regular rhythm.     Pulses: Normal pulses.     Heart sounds: Normal heart sounds.  Pulmonary:     Effort: Pulmonary effort is normal.     Breath sounds: Normal breath sounds.  Musculoskeletal:     Cervical back: Normal range of motion and neck supple.     Comments: Right knee: No erythema or bruising.  Some medial tenderness.  No swelling.  Full range of motion.  Stable in extension and flexion. Rest of right lower extremities: Within normal limits and neurovascularly intact.  No signs of DVT.  Skin:    General: Skin is warm and dry.     Capillary Refill: Capillary refill takes less than 2 seconds.  Neurological:     General: No focal deficit present.     Mental Status: She is alert and oriented to person, place, and time.  Psychiatric:        Mood and Affect: Mood normal.        Behavior: Behavior normal.     DG Knee Complete 4 Views Right  Result Date: 07/12/2019 CLINICAL DATA:  64 year old with acute onset of RIGHT knee pain. EXAM: RIGHT KNEE - COMPLETE 4+ VIEW COMPARISON:  None. FINDINGS: No evidence of acute, subacute or healed fractures. Joint spaces well preserved for age, with only minimal narrowing of the patellofemoral compartment joint space. Bone mineral density well-preserved. No visible joint effusion. IMPRESSION: Minimal narrowing of the patellofemoral compartment joint space. Otherwise normal examination. Electronically Signed   By: Hulan Saas M.D.   On: 07/12/2019 10:24     ASSESSMENT & PLAN: [name] was seen today for right knee pain and wheezing at night.  Diagnoses and all orders for this visit:  Acute pain of right knee -     DG Knee Complete 4 Views Right -     Ambulatory referral to Orthopedic Surgery  Suspected sleep apnea -     Ambulatory referral to Sleep Studies  Body mass index (BMI) of 45.0-49.9 in adult (HCC)  Wheezing -     predniSONE (DELTASONE) 20 MG tablet; Take 2 tablets (40 mg total) by mouth daily with  breakfast for 5 days. -     albuterol (VENTOLIN HFA) 108 (90 Base) MCG/ACT inhaler; Inhale 2 puffs into the lungs 2 (two) times daily for 7 days.  History of multiple allergies -     Ambulatory referral to Allergy -     predniSONE (DELTASONE) 20 MG tablet; Take 2 tablets (40 mg total) by mouth daily with breakfast for 5 days.    Patient Instructions       If  you have lab work done today you will be contacted with your lab results within the next 2 weeks.  If you have not heard from [place] then please contact us. The fastest way to get your results is to register for My Chart.   IF you received an x-ray today, you will receive an invoice from [facility]. Please contact [facility] at [phone] with questions or concerns regarding your invoice.   IF you received labwork today, you will receive an invoice from [place]. Please contact [facility] at [phone] with questions or concerns regarding your invoice.   Our billing staff will not be able to assist you with questions regarding bills from these companies.  You will be contacted with the lab results as soon as they are available. The fastest way to get your results is to activate your My Chart account. Instructions are located on the last page of this paperwork. If you have not heard from [place] regarding the results in 2 weeks, please contact this office.       Calorie Counting for Weight Loss Calories are units of energy. Your body needs a certain amount of calories from food to keep you going throughout the day. When you eat more calories than your body needs, your body stores the extra calories as fat. When you eat fewer calories than your body needs, your body burns fat to get the energy it needs. Calorie counting means keeping track of how many calories you eat and drink each day. Calorie counting can be helpful if you need to lose weight. If you make sure to eat fewer calories than your body needs, you should  lose weight. Ask your health care provider what a healthy weight is for you. For calorie counting to work, you will need to eat the right number of calories in a day in order to lose a healthy amount of weight per week. A dietitian can help you determine how many calories you need in a day and will give you suggestions on how to reach your calorie goal.  A healthy amount of weight to lose per week is usually 1-2 lb (0.5-0.9 kg). This usually means that your daily calorie intake should be reduced by 500-750 calories.  Eating 1,200 - 1,500 calories per day can help most women lose weight.  Eating 1,500 - 1,800 calories per day can help most men lose weight. What is my plan? My goal is to have __________ calories per day. If I have this many calories per day, I should lose around __________ pounds per week. What do I need to know about calorie counting? In order to meet your daily calorie goal, you will need to:  Find out how many calories are in each food you would like to eat. Try to do this before you eat.  Decide how much of the food you plan to eat.  Write down what you ate and how many calories it had. Doing this is called keeping a food log. To successfully lose weight, it is important to balance calorie counting with a healthy lifestyle that includes regular activity. Aim for 150 minutes of moderate exercise (such as walking) or 75 minutes of vigorous exercise (such as running) each week. Where do I find calorie information?  The number of calories in a food can be found on a Nutrition Facts label. If a food does not have a Nutrition Facts label, try to look up the calories online or ask your dietitian for help. Remember  that calories are listed per serving. If you choose to have more than one serving of a food, you will have to multiply the calories per serving by the amount of servings you plan to eat. For example, the label on a package of bread might say that a serving size is 1 slice  and that there are 90 calories in a serving. If you eat 1 slice, you will have eaten 90 calories. If you eat 2 slices, you will have eaten 180 calories. How do I keep a food log? Immediately after each meal, record the following information in your food log:  What you ate. Don't forget to include toppings, sauces, and other extras on the food.  How much you ate. This can be measured in cups, ounces, or number of items.  How many calories each food and drink had.  The total number of calories in the meal. Keep your food log near you, such as in a small notebook in your pocket, or use a mobile app or website. Some programs will calculate calories for you and show you how many calories you have left for the day to meet your goal. What are some calorie counting tips?   Use your calories on foods and drinks that will fill you up and not leave you hungry: ? Some examples of foods that fill you up are nuts and nut butters, vegetables, lean proteins, and high-fiber foods like whole grains. High-fiber foods are foods with more than 5 g fiber per serving. ? Drinks such as sodas, specialty coffee drinks, alcohol, and juices have a lot of calories, yet do not fill you up.  Eat nutritious foods and avoid empty calories. Empty calories are calories you get from foods or beverages that do not have many vitamins or protein, such as candy, sweets, and soda. It is better to have a nutritious high-calorie food (such as an avocado) than a food with few nutrients (such as a bag of chips).  Know how many calories are in the foods you eat most often. This will help you calculate calorie counts faster.  Pay attention to calories in drinks. Low-calorie drinks include water and unsweetened drinks.  Pay attention to nutrition labels for "low fat" or "fat free" foods. These foods sometimes have the same amount of calories or more calories than the full fat versions. They also often have added sugar, starch, or salt, to  make up for flavor that was removed with the fat.  Find a way of tracking calories that works for you. Get creative. Try different apps or programs if writing down calories does not work for you. What are some portion control tips?  Know how many calories are in a serving. This will help you know how many servings of a certain food you can have.  Use a measuring cup to measure serving sizes. You could also try weighing out portions on a kitchen scale. With time, you will be able to estimate serving sizes for some foods.  Take some time to put servings of different foods on your favorite plates, bowls, and cups so you know what a serving looks like.  Try not to eat straight from a bag or box. Doing this can lead to overeating. Put the amount you would like to eat in a cup or on a plate to make sure you are eating the right portion.  Use smaller plates, glasses, and bowls to prevent overeating.  Try not to multitask (for example, watch TV  or use your computer) while eating. If it is time to eat, sit down at a table and enjoy your food. This will help you to know when you are full. It will also help you to be aware of what you are eating and how much you are eating. What are tips for following this plan? Reading food labels  Check the calorie count compared to the serving size. The serving size may be smaller than what you are used to eating.  Check the source of the calories. Make sure the food you are eating is high in vitamins and protein and low in saturated and trans fats. Shopping  Read nutrition labels while you shop. This will help you make healthy decisions before you decide to purchase your food.  Make a grocery list and stick to it. Cooking  Try to cook your favorite foods in a healthier way. For example, try baking instead of frying.  Use low-fat dairy products. Meal planning  Use more fruits and vegetables. Half of your plate should be fruits and vegetables.  Include  lean proteins like poultry and fish. How do I count calories when eating out?  Ask for smaller portion sizes.  Consider sharing an entree and sides instead of getting your own entree.  If you get your own entree, eat only half. Ask for a box at the beginning of your meal and put the rest of your entree in it so you are not tempted to eat it.  If calories are listed on the menu, choose the lower calorie options.  Choose dishes that include vegetables, fruits, whole grains, low-fat dairy products, and lean protein.  Choose items that are boiled, broiled, grilled, or steamed. Stay away from items that are buttered, battered, fried, or served with cream sauce. Items labeled "crispy" are usually fried, unless stated otherwise.  Choose water, low-fat milk, unsweetened iced tea, or other drinks without added sugar. If you want an alcoholic beverage, choose a lower calorie option such as a glass of wine or light beer.  Ask for dressings, sauces, and syrups on the side. These are usually high in calories, so you should limit the amount you eat.  If you want a salad, choose a garden salad and ask for grilled meats. Avoid extra toppings like bacon, cheese, or fried items. Ask for the dressing on the side, or ask for olive oil and vinegar or lemon to use as dressing.  Estimate how many servings of a food you are given. For example, a serving of cooked rice is  cup or about the size of half a baseball. Knowing serving sizes will help you be aware of how much food you are eating at restaurants. The list below tells you how big or small some common portion sizes are based on everyday objects: ? 1 oz--4 stacked dice. ? 3 oz--1 deck of cards. ? 1 tsp--1 die. ? 1 Tbsp-- a ping-pong ball. ? 2 Tbsp--1 ping-pong ball. ?  cup-- baseball. ? 1 cup--1 baseball. Summary  Calorie counting means keeping track of how many calories you eat and drink each day. If you eat fewer calories than your body needs, you  should lose weight.  A healthy amount of weight to lose per week is usually 1-2 lb (0.5-0.9 kg). This usually means reducing your daily calorie intake by 500-750 calories.  The number of calories in a food can be found on a Nutrition Facts label. If a food does not have a Nutrition Facts label,  try to look up the calories online or ask your dietitian for help.  Use your calories on foods and drinks that will fill you up, and not on foods and drinks that will leave you hungry.  Use smaller plates, glasses, and bowls to prevent overeating. This information is not intended to replace advice given to you by your health care provider. Make sure you discuss any questions you have with your health care provider. Document Revised: 09/10/2017 Document Reviewed: 11/22/2015 Elsevier Patient Education  2020 Elsevier Inc.  Acute Knee Pain, Adult Many things can cause knee pain. Sometimes, knee pain is sudden (acute) and may be caused by damage, swelling, or irritation of the muscles and tissues that support your knee. The pain often goes away on its own with time and rest. If the pain does not go away, tests may be done to find out what is causing the pain. Follow these instructions at home: Pay attention to any changes in your symptoms. Take these actions to relieve your pain. If you have a knee sleeve or brace:   Wear the sleeve or brace as told by your doctor. Remove it only as told by your doctor.  Loosen the sleeve or brace if your toes: ? Tingle. ? Become numb. ? Turn cold and blue.  Keep the sleeve or brace clean.  If the sleeve or brace is not waterproof: ? Do not let it get wet. ? Cover it with a watertight covering when you take a bath or shower. Activity  Rest your knee.  Do not do things that cause pain.  Avoid activities where both feet leave the ground at the same time (high-impact activities). Examples are running, jumping rope, and doing jumping jacks.  Work with a  physical therapist to make a safe exercise program, as told by your doctor. Managing pain, stiffness, and swelling   If told, put ice on the knee: ? Put ice in a plastic bag. ? Place a towel between your skin and the bag. ? Leave the ice on for 20 minutes, 2-3 times a day.  If told, put pressure (compression) on your injured knee to control swelling, give support, and help with discomfort. Compression may be done with an elastic bandage. General instructions  Take all medicines only as told by your doctor.  Raise (elevate) your knee while you are sitting or lying down. Make sure your knee is higher than your heart.  Sleep with a pillow under your knee.  Do not use any products that contain nicotine or tobacco. These include cigarettes, e-cigarettes, and chewing tobacco. These products may slow down healing. If you need help quitting, ask your doctor.  If you are overweight, work with your doctor and a food expert (dietitian) to set goals to lose weight. Being overweight can make your knee hurt more.  Keep all follow-up visits as told by your doctor. This is important. Contact a doctor if:  The knee pain does not stop.  The knee pain changes or gets worse.  You have a fever along with knee pain.  Your knee feels warm when you touch it.  Your knee gives out or locks up. Get help right away if:  Your knee swells, and the swelling gets worse.  You cannot move your knee.  You have very bad knee pain. Summary  Many things can cause knee pain. The pain often goes away on its own with time and rest.  Your doctor may do tests to find out the  cause of the pain.  Pay attention to any changes in your symptoms. Relieve your pain with rest, medicines, light activity, and use of ice.  Get help right away if you cannot move your knee or your knee pain is very bad. This information is not intended to replace advice given to you by your health care provider. Make sure you discuss any  questions you have with your health care provider. Document Revised: 06/03/2017 Document Reviewed: 06/03/2017 Elsevier Patient Education  2020 Elsevier Inc.      Edwina Barth, MD [facility]

## 2019-07-12 NOTE — Patient Instructions (Addendum)
If you have lab work done today you will be contacted with your lab results within the next 2 weeks.  If you have not heard from [place] then please contact us. The fastest way to get your results is to register for My Chart.   IF you received an x-ray today, you will receive an invoice from [facility]. Please contact [facility] at [phone] with questions or concerns regarding your invoice.   IF you received labwork today, you will receive an invoice from [facility]. Please contact [facility] at [phone] with questions or concerns regarding your invoice.   Our billing staff will not be able to assist you with questions regarding bills from these companies.  You will be contacted with the lab results as soon as they are available. The fastest way to get your results is to activate your My Chart account. Instructions are located on the last page of this paperwork. If you have not heard from [place] regarding the results in 2 weeks, please contact this office.       Calorie Counting for Weight Loss Calories are units of energy. Your body needs a certain amount of calories from food to keep you going throughout the day. When you eat more calories than your body needs, your body stores the extra calories as fat. When you eat fewer calories than your body needs, your body burns fat to get the energy it needs. Calorie counting means keeping track of how many calories you eat and drink each day. Calorie counting can be helpful if you need to lose weight. If you make sure to eat fewer calories than your body needs, you should lose weight. Ask your health care provider what a healthy weight is for you. For calorie counting to work, you will need to eat the right number of calories in a day in order to lose a healthy amount of weight per week. A dietitian can help you determine how many calories you need in a day and will give you suggestions on how to reach your calorie goal.  A  healthy amount of weight to lose per week is usually 1-2 lb (0.5-0.9 kg). This usually means that your daily calorie intake should be reduced by 500-750 calories.  Eating 1,200 - 1,500 calories per day can help most women lose weight.  Eating 1,500 - 1,800 calories per day can help most men lose weight. What is my plan? My goal is to have __________ calories per day. If I have this many calories per day, I should lose around __________ pounds per week. What do I need to know about calorie counting? In order to meet your daily calorie goal, you will need to:  Find out how many calories are in each food you would like to eat. Try to do this before you eat.  Decide how much of the food you plan to eat.  Write down what you ate and how many calories it had. Doing this is called keeping a food log. To successfully lose weight, it is important to balance calorie counting with a healthy lifestyle that includes regular activity. Aim for 150 minutes of moderate exercise (such as walking) or 75 minutes of vigorous exercise (such as running) each week. Where do I find calorie information?  The number of calories in a food can be found on a Nutrition Facts label. If a food does not have a Nutrition Facts label, try to look up the calories online or ask your  for help. Remember that calories are listed per serving. If you choose to have more than one serving of a food, you will have to multiply the calories per serving by the amount of servings you plan to eat. For example, the label on a package of bread might say that a serving size is 1 slice and that there are 90 calories in a serving. If you eat 1 slice, you will have eaten 90 calories. If you eat 2 slices, you will have eaten 180 calories. How do I keep a food log? Immediately after each meal, record the following information in your food log:  What you ate. Don't forget to include toppings, sauces, and other extras on the food.  How much you  ate. This can be measured in cups, ounces, or number of items.  How many calories each food and drink had.  The total number of calories in the meal. Keep your food log near you, such as in a small notebook in your pocket, or use a mobile app or website. Some programs will calculate calories for you and show you how many calories you have left for the day to meet your goal. What are some calorie counting tips?   Use your calories on foods and drinks that will fill you up and not leave you hungry: ? Some examples of foods that fill you up are nuts and nut butters, vegetables, lean proteins, and high-fiber foods like whole grains. High-fiber foods are foods with more than 5 g fiber per serving. ? Drinks such as sodas, specialty coffee drinks, alcohol, and juices have a lot of calories, yet do not fill you up.  Eat nutritious foods and avoid empty calories. Empty calories are calories you get from foods or beverages that do not have many vitamins or protein, such as candy, sweets, and soda. It is better to have a nutritious high-calorie food (such as an avocado) than a food with few nutrients (such as a bag of chips).  Know how many calories are in the foods you eat most often. This will help you calculate calorie counts faster.  Pay attention to calories in drinks. Low-calorie drinks include water and unsweetened drinks.  Pay attention to nutrition labels for "low fat" or "fat free" foods. These foods sometimes have the same amount of calories or more calories than the full fat versions. They also often have added sugar, starch, or salt, to make up for flavor that was removed with the fat.  Find a way of tracking calories that works for you. Get creative. Try different apps or programs if writing down calories does not work for you. What are some portion control tips?  Know how many calories are in a serving. This will help you know how many servings of a certain food you can have.  Use a  measuring cup to measure serving sizes. You could also try weighing out portions on a kitchen scale. With time, you will be able to estimate serving sizes for some foods.  Take some time to put servings of different foods on your favorite plates, bowls, and cups so you know what a serving looks like.  Try not to eat straight from a bag or box. Doing this can lead to overeating. Put the amount you would like to eat in a cup or on a plate to make sure you are eating the right portion.  Use smaller plates, glasses, and bowls to prevent overeating.  Try not to multitask (for   for example, watch TV or use your computer) while eating. If it is time to eat, sit down at a table and enjoy your food. This will help you to know when you are full. It will also help you to be aware of what you are eating and how much you are eating. What are tips for following this plan? Reading food labels  Check the calorie count compared to the serving size. The serving size may be smaller than what you are used to eating.  Check the source of the calories. Make sure the food you are eating is high in vitamins and protein and low in saturated and trans fats. Shopping  Read nutrition labels while you shop. This will help you make healthy decisions before you decide to purchase your food.  Make a grocery list and stick to it. Cooking  Try to cook your favorite foods in a healthier way. For example, try baking instead of frying.  Use low-fat dairy products. Meal planning  Use more fruits and vegetables. Half of your plate should be fruits and vegetables.  Include lean proteins like poultry and fish. How do I count calories when eating out?  Ask for smaller portion sizes.  Consider sharing an entree and sides instead of getting your own entree.  If you get your own entree, eat only half. Ask for a box at the beginning of your meal and put the rest of your entree in it so you are not tempted to eat it.  If calories  are listed on the menu, choose the lower calorie options.  Choose dishes that include vegetables, fruits, whole grains, low-fat dairy products, and lean protein.  Choose items that are boiled, broiled, grilled, or steamed. Stay away from items that are buttered, battered, fried, or served with cream sauce. Items labeled "crispy" are usually fried, unless stated otherwise.  Choose water, low-fat milk, unsweetened iced tea, or other drinks without added sugar. If you want an alcoholic beverage, choose a lower calorie option such as a glass of wine or light beer.  Ask for dressings, sauces, and syrups on the side. These are usually high in calories, so you should limit the amount you eat.  If you want a salad, choose a garden salad and ask for grilled meats. Avoid extra toppings like bacon, cheese, or fried items. Ask for the dressing on the side, or ask for olive oil and vinegar or lemon to use as dressing.  Estimate how many servings of a food you are given. For example, a serving of cooked rice is  cup or about the size of half a baseball. Knowing serving sizes will help you be aware of how much food you are eating at restaurants. The list below tells you how big or small some common portion sizes are based on everyday objects: ? 1 oz--4 stacked dice. ? 3 oz--1 deck of cards. ? 1 tsp--1 die. ? 1 Tbsp-- a ping-pong ball. ? 2 Tbsp--1 ping-pong ball. ?  cup-- baseball. ? 1 cup--1 baseball. Summary  Calorie counting means keeping track of how many calories you eat and drink each day. If you eat fewer calories than your body needs, you should lose weight.  A healthy amount of weight to lose per week is usually 1-2 lb (0.5-0.9 kg). This usually means reducing your daily calorie intake by 500-750 calories.  The number of calories in a food can be found on a Nutrition Facts label. If a food does not have a  Nutrition Facts label, try to look up the calories online or ask your dietitian for  help.  Use your calories on foods and drinks that will fill you up, and not on foods and drinks that will leave you hungry.  Use smaller plates, glasses, and bowls to prevent overeating. This information is not intended to replace advice given to you by your health care provider. Make sure you discuss any questions you have with your health care provider. Document Revised: 09/10/2017 Document Reviewed: 11/22/2015 Elsevier Patient Education  2020 [employer]  Acute Knee Pain, Adult Many things can cause knee pain. Sometimes, knee pain is sudden (acute) and may be caused by damage, swelling, or irritation of the muscles and tissues that support your knee. The pain often goes away on its own with time and rest. If the pain does not go away, tests may be done to find out what is causing the pain. Follow these instructions at home: Pay attention to any changes in your symptoms. Take these actions to relieve your pain. If you have a knee sleeve or brace:   Wear the sleeve or brace as told by your doctor. Remove it only as told by your doctor.  Loosen the sleeve or brace if your toes: ? Tingle. ? Become numb. ? Turn cold and blue.  Keep the sleeve or brace clean.  If the sleeve or brace is not waterproof: ? Do not let it get wet. ? Cover it with a watertight covering when you take a bath or shower. Activity  Rest your knee.  Do not do things that cause pain.  Avoid activities where both feet leave the ground at the same time (high-impact activities). Examples are running, jumping rope, and doing jumping jacks.  Work with a physical therapist to make a safe exercise program, as told by your doctor. Managing pain, stiffness, and swelling   If told, put ice on the knee: ? Put ice in a plastic bag. ? Place a towel between your skin and the bag. ? Leave the ice on for 20 minutes, 2-3 times a day.  If told, put pressure (compression) on your injured knee to control swelling, give  support, and help with discomfort. Compression may be done with an elastic bandage. General instructions  Take all medicines only as told by your doctor.  Raise (elevate) your knee while you are sitting or lying down. Make sure your knee is higher than your heart.  Sleep with a pillow under your knee.  Do not use any products that contain nicotine or tobacco. These include cigarettes, e-cigarettes, and chewing tobacco. These products may slow down healing. If you need help quitting, ask your doctor.  If you are overweight, work with your doctor and a food expert (dietitian) to set goals to lose weight. Being overweight can make your knee hurt more.  Keep all follow-up visits as told by your doctor. This is important. Contact a doctor if:  The knee pain does not stop.  The knee pain changes or gets worse.  You have a fever along with knee pain.  Your knee feels warm when you touch it.  Your knee gives out or locks up. Get help right away if:  Your knee swells, and the swelling gets worse.  You cannot move your knee.  You have very bad knee pain. Summary  Many things can cause knee pain. The pain often goes away on its own with time and rest.  Your doctor may do tests  to find out the cause of the pain.  Pay attention to any changes in your symptoms. Relieve your pain with rest, medicines, light activity, and use of ice.  Get help right away if you cannot move your knee or your knee pain is very bad. This information is not intended to replace advice given to you by your health care provider. Make sure you discuss any questions you have with your health care provider. Document Revised: 06/03/2017 Document Reviewed: 06/03/2017 Elsevier Patient Education  2020 [employer].

## 2019-07-19 ENCOUNTER — Ambulatory Visit (INDEPENDENT_AMBULATORY_CARE_PROVIDER_SITE_OTHER): Payer: Medicare Other | Admitting: Orthopedic Surgery

## 2019-07-19 VITALS — Ht 67.0 in | Wt 315.0 lb

## 2019-07-19 DIAGNOSIS — M1711 Unilateral primary osteoarthritis, right knee: Secondary | ICD-10-CM

## 2019-07-19 MED ORDER — MELOXICAM 15 MG PO TABS: ORAL_TABLET | ORAL | 0 refills | Status: DC

## 2019-07-22 ENCOUNTER — Encounter: Payer: Self-pay | Admitting: Orthopedic Surgery

## 2019-07-22 NOTE — Progress Notes (Signed)
Office Visit Note   Patient: Deanna Park           Date of Birth: [birth year]           MRN: [id number] Visit Date: 07/19/2019 Requested by: Georgina Quint, MD [address] [place],  [place] [address] PCP: Georgina Quint, MD  Subjective: Chief Complaint  Patient presents with  . Right Knee - Pain    HPI: Deanna Park is a patient with right knee pain.  She describes acute pain since July 4.  She is trying to exercise going up stairs.  She has been wearing a brace.  It has improved some since July 4.  She reports fairly significant pain after she sits.  Plain radiographs demonstrate fairly well-maintained joint spaces with minimal narrowing of the patellofemoral joint.  She reports primarily medial sided pain.  She describes difficulty with weightbearing.  She is retired.  Describes primarily start up pain.  Toothache type pain is reported.  Does have a history of sciatica.  Takes aspirin as well.  Has not taken any anti-inflammatories yet.  Somewhat reluctant about injection.              ROS: All systems reviewed are negative as they relate to the chief complaint within the history of present illness.  Patient denies  fevers or chills.   Assessment & Plan: Visit Diagnoses:  1. Arthritis of right knee     Plan: Impression is mild arthritis right knee versus meniscal tear without mechanical symptoms or effusion.  I would like for her to try Mobic 15 mg a day for 2 weeks then as needed.  I will see her back in 4 weeks.  She does have increased body mass index which may be accounting for some of the symptoms in terms of stress reaction.  I will see her back in 4 weeks for clinical recheck for decision about further management.  Follow-Up Instructions: Return in about 4 weeks (around 08/16/2019).   Orders:  No orders of the defined types were placed in this encounter.  Meds ordered this encounter  Medications  . meloxicam (MOBIC) 15 MG tablet    Sig: 1 po q d x 2 weeks  then prn    Dispense:  30 tablet    Refill:  0      Procedures: No procedures performed   Clinical Data: No additional findings.  Objective: Vital Signs: Ht 5\' 7"  (1.702 m)   Wt (!) 315 lb (142.9 kg)   BMI 49.34 kg/m   Physical Exam:   Constitutional: Patient appears well-developed HEENT:  Head: Normocephalic Eyes:EOM are normal Neck: Normal range of motion Cardiovascular: Normal rate Pulmonary/chest: Effort normal Neurologic: Patient is alert Skin: Skin is warm Psychiatric: Patient has normal mood and affect    Ortho Exam: Ortho exam demonstrates antalgic gait to the right.  No groin pain with internal extra rotation of the leg.  Pedal pulses palpable.  No nerve root tension signs.  Medial greater than lateral joint line tenderness.  Collateral and cruciate ligaments are stable.  Trace effusion right no effusion left.  Specialty Comments:  No specialty comments available.  Imaging: No results found.   PMFS History: There are no problems to display for this patient.  History reviewed. No pertinent past medical history.  Family History  Problem Relation Age of Onset  . Cancer Mother   . Diabetes Father   . Heart disease Father     Past Surgical History:  Procedure  Laterality Date  . WRIST SURGERY     Social History   Occupational History  . Not on file  Tobacco Use  . Smoking status: Never Smoker  . Smokeless tobacco: Never Used  Substance and Sexual Activity  . Alcohol use: No    Comment: occas  . Drug use: No  . Sexual activity: Not on file

## 2019-08-07 NOTE — Progress Notes (Signed)
New Patient Note  RE: Deanna Park MRN: [id number] DOB: [birth year] Date of Office Visit: 08/08/2019  Referring provider: Georgina Quint, * Primary care provider: Georgina Quint, MD  Chief Complaint: Allergy Testing  History of Present Illness: I had the pleasure of seeing Deanna Park for initial evaluation at the [facility] on 08/08/2019. She is a 64 y.o. female, who is referred here by Georgina Quint, MD for the evaluation of allergies, wheezing.  Patient is concerned about possible environmental allergies. Her daughter fosters cats and now that patient has been spending more time at home since COVID-19 and noticing itching on her skin at times which improves after she goes outdoors.  She also reports symptoms of wheezing at times and sometimes wakes up at night choking.   On July 30th, patient pulled something on her right knee. 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 She never experienced this type of pain before even with her dance injuries in the past.  She spoke with a church member and who told her to take an allergy medication. She took a Claritin which helped her knee? She was seen by ortho as well for the knee issues.   She reports symptoms of nasal congestion, PND, headaches. Symptoms have been going on for some years but worse the past year since she has been spending more time at home. The symptoms are present all year around with worsening at home. Other triggers include exposure to possibly cats. Anosmia: no. Headache: yes. She has used Claritin with some improvement in symptoms. Sinus infections: no. Previous work up includes: none. Previous ENT evaluation: no. Previous sinus imaging: no. History of nasal polyps: no. Last eye exam: last year - precataracts History of reflux: yes but does not take any medications for this.  Breathing:  She reports symptoms of wheezing, nocturnal awakenings for 1 years.  Current medications include none. She reports not using aerochamber with inhalers. Main triggers are unknown. In the last month, frequency of symptoms: daily but worse at night.   History of pneumonia: no. She was not evaluated by allergist/pulmonologist in the past. Smoking exposure: no. Up to date with flu vaccine: no. Up to date with COVID-19 vaccine: yes.   Assessment and Plan: Roselene is a 64 y.o. female with: Chronic rhinitis Perennial nasal congestion, postnasal drip and headaches since spending more time at home.  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  Used Claritin with some benefit and interestingly this also helped her knee pain.  Complaining of headache/migraine and knee pain today since of Claritin for skin testing.  Today's skin prick testing showed: Negative to indoor/outdoors allergens. Negative to top common foods. Will do bloodwork instead of intradermals due patient's headache complaint.  Will make additional recommendations based on results.  Discussed with patient that environmental allergies and antihistamines usually do not affect knee pain. I do not have a good explanation as to why Claritin helped her knee pain.   May use over the counter antihistamines such as Zyrtec (cetirizine), Claritin (loratadine), Allegra (fexofenadine), or Xyzal (levocetirizine) daily as needed.   May use Flonase (fluticasone) nasal spray 1 spray per nostril twice a day as needed for nasal congestion.   Nasal saline spray (i.e., Simply Saline) or nasal saline lavage (i.e., NeilMed) is recommended as needed and prior to medicated nasal sprays.  Wheezing Issues with wheezing and nocturnal awakenings for 1 year.  Recently PCP  prescribed albuterol but has not picked it up.  Having symptoms on a daily basis.  No prior asthma or COPD diagnosis.  Today's spirometry showed some restriction mainly due to body habitus.  May use albuterol rescue inhaler 2 puffs every  4 to 6 hours as needed for shortness of breath, chest tightness, coughing, and wheezing. May use albuterol rescue inhaler 2 puffs 5 to 15 minutes prior to strenuous physical activities. Monitor frequency of use.   Recommend that she gets sleep study done as well - referral placed by PCP.   Return in about 2 months (around 10/08/2019).  Meds ordered this encounter  Medications  . fluticasone (FLONASE) 50 MCG/ACT nasal spray    Sig: Place 1 spray into both nostrils in the morning and at bedtime.    Dispense:  16 g    Refill:  2    Lab Orders     Allergens w/Total IgE Area 2  Other allergy screening: Food allergy: no Medication allergy: yes Hymenoptera allergy: no Urticaria: no Eczema:no History of recurrent infections suggestive of immunodeficency: no  Diagnostics: Spirometry:  Tracings reviewed. Her effort: It was hard to get consistent efforts and there is a question as to whether this reflects a maximal maneuver. FVC: 2.04L FEV1: 1.84L, 85% predicted FEV1/FVC ratio: 90% Interpretation: Spirometry consistent with possible restrictive disease.  Please see scanned spirometry results for details.  Skin Testing: Environmental allergy panel and select foods. Negative test to: indoor/outdoors allergens. Negative to top common foods.  Results discussed with patient/family.  Airborne Adult Perc - 08/08/19 1045    Time Antigen Placed 1055    Allergen Manufacturer Greer    Location Back    Number of Test 59    Panel 1 Select    1. Control-Buffer 50% Glycerol Negative    2. Control-Histamine 1 mg/ml 2+    3. Albumin saline Negative    4. Bahia Negative    5. French Southern Territories Negative    6. Johnson Negative    7. Kentucky Blue Negative    8. Meadow Fescue Negative    9. Perennial Rye Negative    10. Sweet Vernal Negative    11. Timothy Negative    12. Cocklebur Negative    13. Burweed Marshelder Negative    14. Ragweed, short Negative    15. Ragweed, Giant Negative    16.  Plantain,  English Negative    17. Lamb's Quarters Negative    18. Sheep Sorrell Negative    19. Rough Pigweed Negative    20. Marsh Elder, Rough Negative    21. Mugwort, Common Negative    22. Ash mix Negative    23. Birch mix Negative    24. Beech American Negative    25. Box, Elder Negative    26. Cedar, red Negative    27. Cottonwood, Guinea-Bissau Negative    28. Elm mix Negative    29. Hickory Negative    30. Maple mix Negative    31. Oak, Guinea-Bissau mix Negative    32. Pecan Pollen Negative    33. Pine mix Negative    34. Sycamore Eastern Negative    35. Walnut, Black Pollen Negative    36. Alternaria alternata Negative    37. Cladosporium Herbarum Negative    38. Aspergillus mix Negative    39. Penicillium mix Negative    40. Bipolaris sorokiniana (Helminthosporium) Negative    41. Drechslera spicifera (Curvularia) Negative    42. Mucor plumbeus Negative    43.  Fusarium moniliforme Negative    44. Aureobasidium pullulans (pullulara) Negative    45. Rhizopus oryzae Negative    46. Botrytis cinera Negative    47. Epicoccum nigrum Negative    48. Phoma betae Negative    49. Candida Albicans Negative    50. Trichophyton mentagrophytes Negative    51. Mite, D Farinae  5,000 AU/ml Negative    52. Mite, D Pteronyssinus  5,000 AU/ml Negative    53. Cat Hair 10,000 BAU/ml Negative    54.  Dog Epithelia Negative    55. Mixed Feathers Negative    56. Horse Epithelia Negative    57. Cockroach, German Negative    58. Mouse Negative    59. Tobacco Leaf Negative          Food Adult Perc - 08/08/19 1000    Time Antigen Placed 1055    Allergen Manufacturer Waynette ButteryGreer    Location Back    Number of allergen test 10    1. Peanut Negative    2. Soybean Negative    3. Wheat Negative    4. Sesame Negative    5. Milk, cow Negative    6. Egg White, Chicken Negative    7. Casein Negative    8. Shellfish Mix Negative    9. Fish Mix Negative    10. Cashew Negative           Past  Medical History: Patient Active Problem List   Diagnosis Date Noted  . Chronic rhinitis 08/08/2019  . Wheezing 08/08/2019   History reviewed. No pertinent past medical history. Past Surgical History: Past Surgical History:  Procedure Laterality Date  . ADENOIDECTOMY    . TONSILLECTOMY    . WRIST SURGERY     Medication List:  Current Outpatient Medications  Medication Sig Dispense Refill  . aspirin EC 325 MG tablet Take 325 mg by mouth daily.    . meloxicam (MOBIC) 15 MG tablet 1 po q d x 2 weeks then prn 30 tablet 0  . albuterol (VENTOLIN HFA) 108 (90 Base) MCG/ACT inhaler Inhale 2 puffs into the lungs 2 (two) times daily for 7 days. 18 g 3  . fluticasone (FLONASE) 50 MCG/ACT nasal spray Place 1 spray into both nostrils in the morning and at bedtime. 16 g 2   No current facility-administered medications for this visit.   Allergies: Allergies  Allergen Reactions  . Azithromycin     Caused diarrhea  . Red Dye Itching and Rash  . Sulfa Antibiotics Itching and Rash   Social History: Social History   Socioeconomic History  . Marital status: Widowed    Spouse name: Not on file  . Number of children: Not on file  . Years of education: Not on file  . Highest education level: Not on file  Occupational History  . Not on file  Tobacco Use  . Smoking status: Never Smoker  . Smokeless tobacco: Never Used  Vaping Use  . Vaping Use: Never used  Substance and Sexual Activity  . Alcohol use: No    Comment: occas  . Drug use: No  . Sexual activity: Not on file  Other Topics Concern  . Not on file  Social History Narrative  . Not on file   Social Determinants of Health   Financial Resource Strain:   . Difficulty of Paying Living Expenses:   Food Insecurity:   . Worried About Programme researcher, broadcasting/film/videounning Out of Food in the Last Year:   . [employer] Out of  Food in the Last Year:   Transportation Needs:   . Freight forwarder (Medical):   Marland Kitchen Lack of Transportation (Non-Medical):   Physical  Activity:   . Days of Exercise per Week:   . Minutes of Exercise per Session:   Stress:   . Feeling of Stress :   Social Connections:   . Frequency of Communication with Friends and Family:   . Frequency of Social Gatherings with Friends and Family:   . Attends Religious Services:   . Active Member of Clubs or Organizations:   . Attends Banker Meetings:   Marland Kitchen Marital Status:    Lives in a house built in 1961. Smoking: denies Occupation: retired  Landscape architect HistorySurveyor, minerals in the house: no Engineer, civil (consulting) in the family room: no Carpet in the bedroom: no Heating: electric Cooling: window Pet: yes 2 cats inside and 3 outdoors  Family History: Family History  Problem Relation Age of Onset  . Cancer Mother   . Diabetes Father   . Heart disease Father    Problem                               Relation Asthma                                   No  Eczema                                No  Food allergy                          No  Allergic rhino conjunctivitis     No   Review of Systems  Constitutional: Negative for appetite change, chills, fever and unexpected weight change.  HENT: Positive for congestion and postnasal drip. Negative for rhinorrhea.   Eyes: Negative for itching.  Respiratory: Positive for wheezing. Negative for cough, chest tightness and shortness of breath.   Cardiovascular: Negative for chest pain.  Gastrointestinal: Negative for abdominal pain.  Genitourinary: Negative for difficulty urinating.  Skin: Negative for rash.  Allergic/Immunologic: Negative for environmental allergies and food allergies.  Neurological: Positive for headaches.   Objective: BP 128/80   Pulse 71   Temp 97.6 F (36.4 C) (Temporal)   Resp 19   Ht 5' 6.75" (1.695 m)   Wt (!) 320 lb 12 oz (145.5 kg)   SpO2 97%   BMI 50.61 kg/m  Body mass index is 50.61 kg/m. Physical Exam Vitals and nursing note reviewed.  Constitutional:      Appearance: Normal  appearance. She is well-developed. She is obese.  HENT:     Head: Normocephalic and atraumatic.     Right Ear: Tympanic membrane and external ear normal.     Left Ear: Tympanic membrane and external ear normal.     Nose: Nose normal.     Mouth/Throat:     Mouth: Mucous membranes are moist.     Pharynx: Oropharynx is clear.  Eyes:     Conjunctiva/sclera: Conjunctivae normal.  Cardiovascular:     Rate and Rhythm: Normal rate and regular rhythm.     Heart sounds: Normal heart sounds. No murmur heard.  No friction rub. No gallop.   Pulmonary:     Effort: Pulmonary effort  is normal.     Breath sounds: Normal breath sounds. No wheezing, rhonchi or rales.  Musculoskeletal:     Cervical back: Neck supple.  Skin:    General: Skin is warm.     Findings: No rash.  Neurological:     Mental Status: She is alert and oriented to person, place, and time.  Psychiatric:        Behavior: Behavior normal.    The plan was reviewed with the patient/family, and all questions/concerned were addressed.  It was my pleasure to see Arhianna today and participate in her care. Please feel free to contact me with any questions or concerns.  Sincerely,  Wyline Mood, DO Allergy & Immunology  [facility] office: [phone] [facility] office: [phone] [place] office: [phone]

## 2019-08-08 ENCOUNTER — Encounter: Payer: Self-pay | Admitting: Allergy

## 2019-08-08 ENCOUNTER — Other Ambulatory Visit: Payer: Self-pay

## 2019-08-08 ENCOUNTER — Ambulatory Visit: Payer: BC Managed Care – PPO | Admitting: Allergy

## 2019-08-08 VITALS — BP 128/80 | HR 71 | Temp 97.6°F | Resp 19 | Ht 66.75 in | Wt 320.8 lb

## 2019-08-08 DIAGNOSIS — R062 Wheezing: Secondary | ICD-10-CM | POA: Diagnosis not present

## 2019-08-08 DIAGNOSIS — J310 Chronic rhinitis: Secondary | ICD-10-CM | POA: Diagnosis not present

## 2019-08-08 MED ORDER — FLUTICASONE PROPIONATE 50 MCG/ACT NA SUSP: 1.0000 | Freq: Two times a day (BID) | NASAL | 2 refills | Status: DC

## 2019-08-08 NOTE — Patient Instructions (Addendum)
Today's skin testing showed: Negative to indoor/outdoors allergens. Negative to top common foods. Will get bloodwork to double check.   May use over the counter antihistamines such as Zyrtec (cetirizine), Claritin (loratadine), Allegra (fexofenadine), or Xyzal (levocetirizine) daily as needed.   May use Flonase (fluticasone) nasal spray 1 spray per nostril twice a day as needed for nasal congestion.   Nasal saline spray (i.e., Simply Saline) or nasal saline lavage (i.e., NeilMed) is recommended as needed and prior to medicated nasal sprays.  . Get bloodwork:  o We are ordering labs, so please allow 1-2 weeks for the results to come back. o With the newly implemented Cures Act, the labs might be visible to you at the same time that they become visible to me. However, I will not address the results until all of the results are back, so please be patient.   Breathing:  May use albuterol rescue inhaler 2 puffs every 4 to 6 hours as needed for shortness of breath, chest tightness, coughing, and wheezing. May use albuterol rescue inhaler 2 puffs 5 to 15 minutes prior to strenuous physical activities. Monitor frequency of use.   follow up in 2 months or sooner if needed.   Get sleep study done.

## 2019-08-08 NOTE — Assessment & Plan Note (Signed)
Perennial nasal congestion, postnasal drip and headaches since spending more time at home.  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  Used Claritin with some benefit and interestingly this also helped her knee pain.  Complaining of headache/migraine and knee pain today since of Claritin for skin testing.  Today's skin prick testing showed: Negative to indoor/outdoors allergens. Negative to top common foods. Will do bloodwork instead of intradermals due patient's headache complaint.  Will make additional recommendations based on results.  Discussed with patient that environmental allergies and antihistamines usually do not affect knee pain. I do not have a good explanation as to why Claritin helped her knee pain.   May use over the counter antihistamines such as Zyrtec (cetirizine), Claritin (loratadine), Allegra (fexofenadine), or Xyzal (levocetirizine) daily as needed.   May use Flonase (fluticasone) nasal spray 1 spray per nostril twice a day as needed for nasal congestion.   Nasal saline spray (i.e., Simply Saline) or nasal saline lavage (i.e., NeilMed) is recommended as needed and prior to medicated nasal sprays.

## 2019-08-08 NOTE — Assessment & Plan Note (Signed)
Issues with wheezing and nocturnal awakenings for 1 year.  Recently PCP prescribed albuterol but has not picked it up.  Having symptoms on a daily basis.  No prior asthma or COPD diagnosis.  Today's spirometry showed some restriction mainly due to body habitus.  May use albuterol rescue inhaler 2 puffs every 4 to 6 hours as needed for shortness of breath, chest tightness, coughing, and wheezing. May use albuterol rescue inhaler 2 puffs 5 to 15 minutes prior to strenuous physical activities. Monitor frequency of use.   Recommend that she gets sleep study done as well - referral placed by PCP.

## 2019-08-12 LAB — ALLERGENS W/TOTAL IGE AREA 2

## 2019-08-13 ENCOUNTER — Other Ambulatory Visit: Payer: Self-pay | Admitting: Orthopedic Surgery

## 2019-08-24 ENCOUNTER — Telehealth: Payer: Self-pay | Admitting: Allergy

## 2019-08-24 NOTE — Telephone Encounter (Signed)
Patient is requesting a copy of her blood work.

## 2019-08-24 NOTE — Telephone Encounter (Signed)
Patient called and would like to have a copay of her blood work sent to her. [phone]

## 2019-08-25 NOTE — Telephone Encounter (Signed)
Informed patient and she stated the only thing she received was information about allergic rhinitis. I did tell patient that it could be on its way to her but if she does not get it by next week to give [place] a call back. Patient expressed understanding.

## 2019-10-09 ENCOUNTER — Other Ambulatory Visit: Payer: Self-pay

## 2019-10-09 ENCOUNTER — Encounter: Payer: Self-pay | Admitting: Allergy

## 2019-10-09 ENCOUNTER — Ambulatory Visit (INDEPENDENT_AMBULATORY_CARE_PROVIDER_SITE_OTHER): Payer: Medicare Other | Admitting: Allergy

## 2019-10-09 VITALS — BP 126/82 | HR 77 | Temp 98.4°F | Resp 16

## 2019-10-09 DIAGNOSIS — R062 Wheezing: Secondary | ICD-10-CM

## 2019-10-09 DIAGNOSIS — J310 Chronic rhinitis: Secondary | ICD-10-CM

## 2019-10-09 NOTE — Patient Instructions (Addendum)
Past skin testing and bloodwork: Negative to indoor/outdoors allergens.   May use over the counter antihistamines such as Zyrtec (cetirizine), Claritin (loratadine), Allegra (fexofenadine), or Xyzal (levocetirizine) daily as needed.   May use Flonase (fluticasone) nasal spray 1 spray per nostril twice a day as needed for nasal congestion.   Nasal saline spray (i.e., Simply Saline) or nasal saline lavage (i.e., NeilMed) is recommended as needed and prior to medicated nasal sprays.  Avoid red dye food products.   Recommend ENT referral next if symptoms worsen.   Breathing:  May use albuterol rescue inhaler 2 puffs every 4 to 6 hours as needed for shortness of breath, chest tightness, coughing, and wheezing. May use albuterol rescue inhaler 2 puffs 5 to 15 minutes prior to strenuous physical activities. Monitor frequency of use.   Follow up in 6 months or sooner if needed.   Follow up with PCP regarding the sleep study done.

## 2019-10-09 NOTE — Assessment & Plan Note (Signed)
Past history - Perennial nasal congestion, postnasal drip and headaches since spending more time at home.  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  Used Claritin with some benefit and interestingly this also helped her knee pain.  Complaining of headache/migraine and knee pain today since off Claritin for skin testing. 2021 skin prick testing showed: Negative to indoor/outdoors allergens. Negative to top common foods.  Interim history - bloodwork negative to environmental panel. Doing better with Flonase. Not using daily antihistamines anymore.  May use over the counter antihistamines such as Zyrtec (cetirizine), Claritin (loratadine), Allegra (fexofenadine), or Xyzal (levocetirizine) daily as needed.   May use Flonase (fluticasone) nasal spray 1 spray per nostril twice a day as needed for nasal congestion.   Nasal saline spray (i.e., Simply Saline) or nasal saline lavage (i.e., NeilMed) is recommended as needed and prior to medicated nasal sprays.  Recommend ENT referral next if symptoms worsen.

## 2019-10-09 NOTE — Progress Notes (Signed)
Follow Up Note  RE: Deanna Park MRN: [id number] DOB: 11/01/1955 Date of Office Visit: 10/09/2019  Referring provider: Georgina Quint, * Primary care provider: Georgina Quint, MD  Chief Complaint: Chronic Rhinitis and Wheezing  History of Present Illness: I had the pleasure of seeing Deanna Park for a follow up visit at the [facility] on 10/09/2019. She is a 64 y.o. female, who is being followed for chronic rhinitis and wheezing. Her previous allergy office visit was on 08/08/2019 with Dr. Selena Batten. Today is a regular follow up visit.  Chronic rhinitis Currently on Flonase 1 spray twice a day with some benefit. She is noting less whistling through her nose. No nosebleeds.  Only used zyrtec once after cutting the grass.  Wheezing Patient had a wheezing episode after cleaning. Not wheezing on a daily basis anymore.  Cats and dust mites usually trigger. Patient did not have to use her inhaler.  Denies any SOB, coughing, chest tightness, nocturnal awakenings, ER/urgent care visits or prednisone use since the last visit.  Patient did not get sleep study done yet.  Assessment and Plan: Deanna Park is a 64 y.o. female with: Chronic rhinitis Past history - Perennial nasal congestion, postnasal drip and headaches since spending more time at home.  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  Used Claritin with some benefit and interestingly this also helped her knee pain.  Complaining of headache/migraine and knee pain today since off Claritin for skin testing. 2021 skin prick testing showed: Negative to indoor/outdoors allergens. Negative to top common foods.  Interim history - bloodwork negative to environmental panel. Doing better with Flonase. Not using daily antihistamines anymore.  May use over the counter antihistamines such as Zyrtec (cetirizine), Claritin (loratadine), Allegra (fexofenadine), or Xyzal  (levocetirizine) daily as needed.   May use Flonase (fluticasone) nasal spray 1 spray per nostril twice a day as needed for nasal congestion.   Nasal saline spray (i.e., Simply Saline) or nasal saline lavage (i.e., NeilMed) is recommended as needed and prior to medicated nasal sprays.  Recommend ENT referral next if symptoms worsen.   Wheezing Past history - Issues with wheezing and nocturnal awakenings for 1 year.  Recently PCP prescribed albuterol but has not picked it up.  Having symptoms on a daily basis.  No prior asthma or COPD diagnosis. 2021 spirometry showed some restriction mainly due to body habitus. Interim history - did not get sleep study done yet. Wheezing after dusting but did not have to use albuterol.   May use albuterol rescue inhaler 2 puffs every 4 to 6 hours as needed for shortness of breath, chest tightness, coughing, and wheezing. May use albuterol rescue inhaler 2 puffs 5 to 15 minutes prior to strenuous physical activities. Monitor frequency of use.   Follow up with PCP regarding the sleep study.  Return in about 6 months (around 04/08/2020).  Diagnostics: None.  Medication List:  Current Outpatient Medications  Medication Sig Dispense Refill  . aspirin EC 325 MG tablet Take 325 mg by mouth daily.    . fluticasone (FLONASE) 50 MCG/ACT nasal spray Place 1 spray into both nostrils in the morning and at bedtime. 16 g 2  . meloxicam (MOBIC) 15 MG tablet TAKE 1 TABLET DAILY FOR 2 WEEKS THEN AS NEEDED 30 tablet 0  . albuterol (VENTOLIN HFA) 108 (90 Base) MCG/ACT inhaler Inhale 2 puffs into the lungs 2 (two) times daily for 7 days. 18 g  3   No current facility-administered medications for this visit.   Allergies: Allergies  Allergen Reactions  . Azithromycin     Caused diarrhea  . Red Dye Itching and Rash  . Sulfa Antibiotics Itching and Rash   I reviewed her past medical history, social history, family history, and environmental history and no significant  changes have been reported from her previous visit.  Review of Systems  Constitutional: Negative for appetite change, chills, fever and unexpected weight change.  HENT: Negative for congestion, postnasal drip and rhinorrhea.   Eyes: Negative for itching.  Respiratory: Negative for cough, chest tightness, shortness of breath and wheezing.   Cardiovascular: Negative for chest pain.  Gastrointestinal: Negative for abdominal pain.  Genitourinary: Negative for difficulty urinating.  Skin: Negative for rash.  Allergic/Immunologic: Negative for environmental allergies and food allergies.  Neurological: Positive for headaches.   Objective: BP 126/82   Pulse 77   Temp 98.4 F (36.9 C) (Temporal)   Resp 16   SpO2 98%  There is no height or weight on file to calculate BMI. Physical Exam Vitals and nursing note reviewed.  Constitutional:      Appearance: Normal appearance. She is well-developed. She is obese.  HENT:     Head: Normocephalic and atraumatic.     Right Ear: Tympanic membrane and external ear normal.     Left Ear: Tympanic membrane and external ear normal.     Nose: Nose normal.     Mouth/Throat:     Mouth: Mucous membranes are moist.     Pharynx: Oropharynx is clear.  Eyes:     Conjunctiva/sclera: Conjunctivae normal.  Cardiovascular:     Rate and Rhythm: Normal rate and regular rhythm.     Heart sounds: Normal heart sounds. No murmur heard.  No friction rub. No gallop.   Pulmonary:     Effort: Pulmonary effort is normal.     Breath sounds: Normal breath sounds. No wheezing, rhonchi or rales.  Musculoskeletal:     Cervical back: Neck supple.  Skin:    General: Skin is warm.     Findings: No rash.  Neurological:     Mental Status: She is alert and oriented to person, place, and time.  Psychiatric:        Behavior: Behavior normal.    Previous notes and tests were reviewed. The plan was reviewed with the patient/family, and all questions/concerned were  addressed.  It was my pleasure to see Deanna Park today and participate in her care. Please feel free to contact me with any questions or concerns.  Sincerely,  Wyline Mood, DO Allergy & Immunology  [facility] office: [phone] [facility] office: [phone]

## 2019-10-09 NOTE — Assessment & Plan Note (Signed)
Past history - Issues with wheezing and nocturnal awakenings for 1 year.  Recently PCP prescribed albuterol but has not picked it up.  Having symptoms on a daily basis.  No prior asthma or COPD diagnosis. 2021 spirometry showed some restriction mainly due to body habitus. Interim history - did not get sleep study done yet. Wheezing after dusting but did not have to use albuterol.   May use albuterol rescue inhaler 2 puffs every 4 to 6 hours as needed for shortness of breath, chest tightness, coughing, and wheezing. May use albuterol rescue inhaler 2 puffs 5 to 15 minutes prior to strenuous physical activities. Monitor frequency of use.   Follow up with PCP regarding the sleep study.

## 2019-10-28 ENCOUNTER — Other Ambulatory Visit: Payer: Self-pay | Admitting: Allergy

## 2019-11-14 ENCOUNTER — Telehealth: Payer: Self-pay | Admitting: *Deleted

## 2019-11-14 NOTE — Telephone Encounter (Signed)
Patient declined mammogram at this time.

## 2020-04-08 ENCOUNTER — Ambulatory Visit (INDEPENDENT_AMBULATORY_CARE_PROVIDER_SITE_OTHER): Payer: Medicare HMO | Admitting: Allergy

## 2020-04-08 ENCOUNTER — Encounter: Payer: Self-pay | Admitting: Allergy

## 2020-04-08 ENCOUNTER — Telehealth: Payer: Self-pay

## 2020-04-08 ENCOUNTER — Other Ambulatory Visit: Payer: Self-pay

## 2020-04-08 VITALS — BP 118/82 | HR 73 | Temp 98.3°F | Resp 16 | Ht 67.75 in | Wt 314.0 lb

## 2020-04-08 DIAGNOSIS — R062 Wheezing: Secondary | ICD-10-CM | POA: Diagnosis not present

## 2020-04-08 DIAGNOSIS — J310 Chronic rhinitis: Secondary | ICD-10-CM | POA: Diagnosis not present

## 2020-04-08 DIAGNOSIS — H9319 Tinnitus, unspecified ear: Secondary | ICD-10-CM | POA: Diagnosis not present

## 2020-04-08 MED ORDER — FLUTICASONE PROPIONATE 50 MCG/ACT NA SUSP: 1.0000 | Freq: Two times a day (BID) | NASAL | 1 refills | Status: DC

## 2020-04-08 MED ORDER — ALBUTEROL SULFATE HFA 108 (90 BASE) MCG/ACT IN AERS: 2.0000 | INHALATION_SPRAY | RESPIRATORY_TRACT | 1 refills | Status: DC | PRN

## 2020-04-08 NOTE — Assessment & Plan Note (Addendum)
Past history - Issues with wheezing and nocturnal awakenings for 1 year. No prior asthma or COPD diagnosis. 2021 spirometry showed some restriction mainly due to body habitus. Interim history - did not get sleep study done yet. Wheezing when around cats too long about once per week. Has HEPA filter.  Today's spirometry was normal.  If you are using your albuterol more than twice per week then let us know - will start daily preventative inhaler.   May use albuterol rescue inhaler 2 puffs every 4 to 6 hours as needed for shortness of breath, chest tightness, coughing, and wheezing. May use albuterol rescue inhaler 2 puffs 5 to 15 minutes prior to strenuous physical activities. Monitor frequency of use.   Follow up with PCP regarding the sleep study.

## 2020-04-08 NOTE — Progress Notes (Signed)
Follow Up Note  RE: Deanna Park MRN: [id number] DOB: 04/11/1955 Date of Office Visit: 04/08/2020  Referring provider: Georgina Quint, * Primary care provider: Georgina Quint, MD  Chief Complaint: chronic rhinitis  History of Present Illness: I had the pleasure of seeing Deanna Park for a follow up visit at the [facility] on 04/08/2020. She is a 65 y.o. female, who is being followed for chronic rhinitis and wheezing. Her previous allergy office visit was on 10/09/2019 with Dr. Selena Batten. Today is a regular follow up visit.  Chronic rhinitis Patient's daughter fosters cats and it seems to affect her ears with ringing in the ear and possibly hearing loss. Sometimes has sinus pressure and just does not feel right.  Takes Flonase 1 spray per nostril once a day - using it every other day. Occasional nosebleeds.  Takes zyrtec prn.  No ENT evaluation.   Wheezing Using albuterol once per week with good benefit. The cats seem to trigger this as well.   Assessment and Plan: Deanna Park is a 65 y.o. female with: Wheezing Past history - Issues with wheezing and nocturnal awakenings for 1 year. No prior asthma or COPD diagnosis. 2021 spirometry showed some restriction mainly due to body habitus. Interim history - did not get sleep study done yet. Wheezing when around cats too long about once per week. Has HEPA filter.  Today's spirometry was normal.  If you are using your albuterol more than twice per week then let us know - will start daily preventative inhaler.   May use albuterol rescue inhaler 2 puffs every 4 to 6 hours as needed for shortness of breath, chest tightness, coughing, and wheezing. May use albuterol rescue inhaler 2 puffs 5 to 15 minutes prior to strenuous physical activities. Monitor frequency of use.   Follow up with PCP regarding the sleep study.  Non-allergic rhinitis Past history - Perennial nasal congestion, postnasal drip and  headaches since spending more time at home.  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  Used Claritin with some benefit and interestingly this also helped her knee pain.  Complaining of headache/migraine and knee pain today since off Claritin for skin testing. 2021 skin prick testing showed: Negative to indoor/outdoors allergens. Bloodwork negative to environmental panel.  Interim history - having mainly some ear issues.   May use Flonase (fluticasone) nasal spray 1 spray per nostril twice a day as needed for nasal congestion.   Nasal saline spray (i.e., Simply Saline) or nasal saline lavage (i.e., NeilMed) is recommended as needed and prior to medicated nasal sprays.  Refer to ENT.   Tinnitus  Refer to ENT.  Return in about 6 months (around 10/08/2020).  Meds ordered this encounter  Medications  . fluticasone (FLONASE) 50 MCG/ACT nasal spray    Sig: Place 1 spray into both nostrils in the morning and at bedtime.    Dispense:  48 mL    Refill:  1    Dispense 3 month supply  . albuterol (VENTOLIN HFA) 108 (90 Base) MCG/ACT inhaler    Sig: Inhale 2 puffs into the lungs every 4 (four) hours as needed for wheezing or shortness of breath.    Dispense:  18 g    Refill:  1   Lab Orders  No laboratory test(s) ordered today    Diagnostics: Spirometry:  Tracings reviewed. Her effort: Good reproducible efforts. FVC: 2.65L FEV1: 2.17L, 98% predicted FEV1/FVC ratio: 82% Interpretation: Spirometry  consistent with normal pattern.  Please see scanned spirometry results for details.  Medication List:  Current Outpatient Medications  Medication Sig Dispense Refill  . aspirin EC 325 MG tablet Take 325 mg by mouth daily.    Marland Kitchen albuterol (VENTOLIN HFA) 108 (90 Base) MCG/ACT inhaler Inhale 2 puffs into the lungs every 4 (four) hours as needed for wheezing or shortness of breath. 18 g 1  . fluticasone (FLONASE) 50 MCG/ACT nasal spray Place 1 spray into both  nostrils in the morning and at bedtime. 48 mL 1   No current facility-administered medications for this visit.   Allergies: Allergies  Allergen Reactions  . Azithromycin     Caused diarrhea  . Red Dye Itching and Rash  . Sulfa Antibiotics Itching and Rash   I reviewed her past medical history, social history, family history, and environmental history and no significant changes have been reported from her previous visit.  Review of Systems  Constitutional: Negative for appetite change, chills, fever and unexpected weight change.  HENT: Positive for hearing loss and tinnitus. Negative for congestion, postnasal drip and rhinorrhea.   Eyes: Negative for itching.  Respiratory: Positive for wheezing. Negative for cough, chest tightness and shortness of breath.   Cardiovascular: Negative for chest pain.  Gastrointestinal: Negative for abdominal pain.  Genitourinary: Negative for difficulty urinating.  Skin: Negative for rash.  Allergic/Immunologic: Negative for environmental allergies and food allergies.  Neurological: Positive for headaches.   Objective: BP 118/82 (BP Location: Right Arm, Patient Position: Sitting, Cuff Size: Normal)   Pulse 73   Temp 98.3 F (36.8 C)   Resp 16   Ht 5' 7.75" (1.721 m)   Wt (!) 314 lb (142.4 kg)   SpO2 98%   BMI 48.10 kg/m  Body mass index is 48.1 kg/m. Physical Exam Vitals and nursing note reviewed.  Constitutional:      Appearance: Normal appearance. She is well-developed. She is obese.  HENT:     Head: Normocephalic and atraumatic.     Right Ear: Tympanic membrane and external ear normal.     Left Ear: Tympanic membrane and external ear normal.     Nose: Nose normal.     Mouth/Throat:     Mouth: Mucous membranes are moist.     Pharynx: Oropharynx is clear.  Eyes:     Conjunctiva/sclera: Conjunctivae normal.  Cardiovascular:     Rate and Rhythm: Normal rate and regular rhythm.     Heart sounds: Normal heart sounds. No murmur  heard. No friction rub. No gallop.   Pulmonary:     Effort: Pulmonary effort is normal.     Breath sounds: Normal breath sounds. No wheezing, rhonchi or rales.  Musculoskeletal:     Cervical back: Neck supple.  Skin:    General: Skin is warm.     Findings: No rash.  Neurological:     Mental Status: She is alert and oriented to person, place, and time.  Psychiatric:        Behavior: Behavior normal.    Previous notes and tests were reviewed. The plan was reviewed with the patient/family, and all questions/concerned were addressed.  It was my pleasure to see Deanna Park today and participate in her care. Please feel free to contact me with any questions or concerns.  Sincerely,  Wyline Mood, DO Allergy & Immunology  [facility] office: [phone] [facility] office: [phone]

## 2020-04-08 NOTE — Telephone Encounter (Signed)
Please refer to ENT per Dr. Selena Batten.Thank you

## 2020-04-08 NOTE — Patient Instructions (Addendum)
Ears:   May use Flonase (fluticasone) nasal spray 1 spray per nostril twice a day as needed for nasal congestion.   Nasal saline spray (i.e., Simply Saline) or nasal saline lavage (i.e., NeilMed) is recommended as needed and prior to medicated nasal sprays.  Refer to ENT.   Breathing:  Today's breathing test was normal.  If you are using your albuterol more than twice per week then let us know.   May use albuterol rescue inhaler 2 puffs every 4 to 6 hours as needed for shortness of breath, chest tightness, coughing, and wheezing. May use albuterol rescue inhaler 2 puffs 5 to 15 minutes prior to strenuous physical activities. Monitor frequency of use.    Follow up in 6 months or sooner if needed.   Follow up with PCP regarding the sleep study done.

## 2020-04-08 NOTE — Assessment & Plan Note (Signed)
Past history - Perennial nasal congestion, postnasal drip and headaches since spending more time at home.  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  Used Claritin with some benefit and interestingly this also helped her knee pain.  Complaining of headache/migraine and knee pain today since off Claritin for skin testing. 2021 skin prick testing showed: Negative to indoor/outdoors allergens. Bloodwork negative to environmental panel.  Interim history - having mainly some ear issues.   May use Flonase (fluticasone) nasal spray 1 spray per nostril twice a day as needed for nasal congestion.   Nasal saline spray (i.e., Simply Saline) or nasal saline lavage (i.e., NeilMed) is recommended as needed and prior to medicated nasal sprays.  Refer to ENT.

## 2020-04-08 NOTE — Assessment & Plan Note (Signed)
Refer to ENT

## 2020-04-09 NOTE — Telephone Encounter (Signed)
Referral has been placed with Dr. Avel Sensor office for tinnitus. Referral, notes and demographics have been faxed to [phone]. Patient informed and told to call if she does not hear from them within a few days.

## 2020-04-26 NOTE — Telephone Encounter (Signed)
Patient is scheduled for 05/20/2020 @ 9 with Dr Suszanne Conners. Per fax request received via fax.

## 2020-09-03 ENCOUNTER — Other Ambulatory Visit: Payer: Self-pay | Admitting: Family Medicine

## 2020-09-03 DIAGNOSIS — Z1231 Encounter for screening mammogram for malignant neoplasm of breast: Secondary | ICD-10-CM

## 2020-10-14 ENCOUNTER — Ambulatory Visit: Payer: Medicare HMO | Admitting: Allergy

## 2020-10-28 ENCOUNTER — Encounter: Payer: Self-pay | Admitting: Allergy

## 2020-10-28 ENCOUNTER — Ambulatory Visit: Payer: Medicare HMO | Admitting: Allergy

## 2020-10-28 ENCOUNTER — Other Ambulatory Visit: Payer: Self-pay

## 2020-10-28 VITALS — BP 122/76 | HR 78 | Temp 97.8°F | Resp 18 | Ht 67.75 in | Wt 314.1 lb

## 2020-10-28 DIAGNOSIS — R062 Wheezing: Secondary | ICD-10-CM

## 2020-10-28 DIAGNOSIS — J310 Chronic rhinitis: Secondary | ICD-10-CM

## 2020-10-28 NOTE — Assessment & Plan Note (Signed)
Past history - Issues with wheezing and nocturnal awakenings for 1 year. No prior asthma or COPD diagnosis. 2021 spirometry showed some restriction mainly due to body habitus. Interim history - did not get sleep study done yet. Wheezing/coughing when outdoors and around cats.   Today's spirometry showed some restriction.  If you are using your albuterol more than twice per week then let us know - will start daily preventative inhaler.   Patient declines prescription for it today as she wants to first limit her triggers.   May use albuterol rescue inhaler 2 puffs every 4 to 6 hours as needed for shortness of breath, chest tightness, coughing, and wheezing. May use albuterol rescue inhaler 2 puffs 5 to 15 minutes prior to strenuous physical activities. Monitor frequency of use.   Follow up with PCP regarding the sleep study.

## 2020-10-28 NOTE — Patient Instructions (Addendum)
Sinus:  Use Flonase (fluticasone) nasal spray 1 spray per nostril twice a day as needed for nasal congestion.  Nasal saline spray (i.e., Simply Saline) or nasal saline lavage (i.e., NeilMed) is recommended as needed and prior to medicated nasal sprays.  Breathing: Today's breathing test showed some restriction.  If you are using your albuterol more than twice per week then let us know.  May use albuterol rescue inhaler 2 puffs every 4 to 6 hours as needed for shortness of breath, chest tightness, coughing, and wheezing. May use albuterol rescue inhaler 2 puffs 5 to 15 minutes prior to strenuous physical activities. Monitor frequency of use.    Follow up in 3 months or sooner if needed.   Follow up with PCP regarding the sleep study.

## 2020-10-28 NOTE — Assessment & Plan Note (Signed)
Past history - Perennial nasal congestion, postnasal drip and headaches since spending more time at home.  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  Used Claritin with some benefit and interestingly this also helped her knee pain.  Complaining of headache/migraine and knee pain today since off Claritin for skin testing. 2021 skin prick testing showed: Negative to indoor/outdoors allergens. Bloodwork negative to environmental panel.  Interim history - rhinitis symptoms present. . Use Flonase (fluticasone) nasal spray 1 spray per nostril twice a day as needed for nasal congestion.   Nasal saline spray (i.e., Simply Saline) or nasal saline lavage (i.e., NeilMed) is recommended as needed and prior to medicated nasal sprays.

## 2020-10-28 NOTE — Progress Notes (Signed)
Follow Up Note  RE: Deanna Park MRN: [id number] DOB: 25-Feb-1955 Date of Office Visit: 10/28/2020  Referring provider: Georgina Quint, * Primary care provider: Georgina Quint, MD  Chief Complaint: Follow-up  History of Present Illness: I had the pleasure of seeing Deanna Park for a follow up visit at the [facility] on 10/28/2020. She is a 65 y.o. female, who is being followed for wheezing, nonallergic rhinitis. Her previous allergy office visit was on 04/08/2020 with Dr. Selena Batten. Today is a regular follow up visit.  Wheezing/coughing:  Using albuterol every 2 days due to coughing with good benefit. She says her triggers are being outdoors. She is not interested in a daily inhaler yet.   Non-allergic rhinitis Patient broke out once in a rash when she was around cats and mold.  Currently on Flonase 1 spray per nostril in the morning. Daughter is still fostering cats - currently has 6 cats at home.  She does have hepa filter in her bedroom.   Patient saw ENT - no issues with the ears per patient report.   Patient did not get sleep study done.   Assessment and Plan: Deanna Park is a 65 y.o. female with: Wheezing Past history - Issues with wheezing and nocturnal awakenings for 1 year. No prior asthma or COPD diagnosis. 2021 spirometry showed some restriction mainly due to body habitus. Interim history - did not get sleep study done yet. Wheezing/coughing when outdoors and around cats.  Today's spirometry showed some restriction. If you are using your albuterol more than twice per week then let us know - will start daily preventative inhaler.  Patient declines prescription for it today as she wants to first limit her triggers.  May use albuterol rescue inhaler 2 puffs every 4 to 6 hours as needed for shortness of breath, chest tightness, coughing, and wheezing. May use albuterol rescue inhaler 2 puffs 5 to 15 minutes prior to strenuous physical  activities. Monitor frequency of use.  Follow up with PCP regarding the sleep study.  Non-allergic rhinitis Past history - Perennial nasal congestion, postnasal drip and headaches since spending more time at home.  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  Used Claritin with some benefit and interestingly this also helped her knee pain.  Complaining of headache/migraine and knee pain today since off Claritin for skin testing. 2021 skin prick testing showed: Negative to indoor/outdoors allergens. Bloodwork negative to environmental panel.  Interim history - rhinitis symptoms present. Use Flonase (fluticasone) nasal spray 1 spray per nostril twice a day as needed for nasal congestion.  Nasal saline spray (i.e., Simply Saline) or nasal saline lavage (i.e., NeilMed) is recommended as needed and prior to medicated nasal sprays.  Return in about 3 months (around 01/28/2021).  No orders of the defined types were placed in this encounter.  Lab Orders  No laboratory test(s) ordered today    Diagnostics: Spirometry:  Tracings reviewed. Her effort: It was hard to get consistent efforts and there is a question as to whether this reflects a maximal maneuver. FVC: 2.20L FEV1: 1.89L, 87% predicted FEV1/FVC ratio: 86% Interpretation: Spirometry consistent with possible restrictive disease.  Please see scanned spirometry results for details.  Medication List:  Current Outpatient Medications  Medication Sig Dispense Refill   albuterol (VENTOLIN HFA) 108 (90 Base) MCG/ACT inhaler Inhale 2 puffs into the lungs every 4 (four) hours as needed for wheezing or shortness of breath. 18 g 1  aspirin EC 325 MG tablet Take 325 mg by mouth daily.     fluticasone (FLONASE) 50 MCG/ACT nasal spray Place 1 spray into both nostrils in the morning and at bedtime. 48 mL 1   No current facility-administered medications for this visit.   Allergies: Allergies  Allergen Reactions    Azithromycin     Caused diarrhea   Red Dye Itching and Rash   Sulfa Antibiotics Itching and Rash   I reviewed her past medical history, social history, family history, and environmental history and no significant changes have been reported from her previous visit.  Review of Systems  Constitutional:  Negative for appetite change, chills, fever and unexpected weight change.  HENT:  Negative for congestion, postnasal drip and rhinorrhea.   Eyes:  Negative for itching.  Respiratory:  Positive for cough and wheezing. Negative for chest tightness and shortness of breath.   Cardiovascular:  Negative for chest pain.  Gastrointestinal:  Negative for abdominal pain.  Genitourinary:  Negative for difficulty urinating.  Skin:  Negative for rash.  Allergic/Immunologic: Negative for environmental allergies and food allergies.   Objective: BP 122/76   Pulse 78   Temp 97.8 F (36.6 C) (Temporal)   Resp 18   Ht 5' 7.75" (1.721 m)   Wt (!) 314 lb 2 oz (142.5 kg)   SpO2 100%   BMI 48.12 kg/m  Body mass index is 48.12 kg/m. Physical Exam Vitals and nursing note reviewed.  Constitutional:      Appearance: Normal appearance. She is well-developed. She is obese.  HENT:     Head: Normocephalic and atraumatic.     Right Ear: Tympanic membrane and external ear normal.     Left Ear: Tympanic membrane and external ear normal.     Nose: Nose normal.     Mouth/Throat:     Mouth: Mucous membranes are moist.     Pharynx: Oropharynx is clear.  Eyes:     Conjunctiva/sclera: Conjunctivae normal.  Cardiovascular:     Rate and Rhythm: Normal rate and regular rhythm.     Heart sounds: Normal heart sounds. No murmur heard.   No friction rub. No gallop.  Pulmonary:     Effort: Pulmonary effort is normal.     Breath sounds: Normal breath sounds. No wheezing, rhonchi or rales.  Musculoskeletal:     Cervical back: Neck supple.  Skin:    General: Skin is warm.     Findings: No rash.  Neurological:      Mental Status: She is alert and oriented to person, place, and time.  Psychiatric:        Behavior: Behavior normal.   Previous notes and tests were reviewed. The plan was reviewed with the patient/family, and all questions/concerned were addressed.  It was my pleasure to see Deanna Park today and participate in her care. Please feel free to contact me with any questions or concerns.  Sincerely,  Wyline Mood, DO Allergy & Immunology  [facility] office: [phone] [facility] office: [phone]

## 2021-12-17 ENCOUNTER — Ambulatory Visit: Payer: Medicare HMO | Admitting: Allergy

## 2021-12-17 ENCOUNTER — Encounter: Payer: Self-pay | Admitting: Allergy

## 2021-12-17 ENCOUNTER — Other Ambulatory Visit: Payer: Self-pay

## 2021-12-17 VITALS — BP 136/86 | HR 64 | Temp 98.0°F | Resp 18

## 2021-12-17 DIAGNOSIS — R062 Wheezing: Secondary | ICD-10-CM | POA: Diagnosis not present

## 2021-12-17 DIAGNOSIS — J310 Chronic rhinitis: Secondary | ICD-10-CM | POA: Diagnosis not present

## 2021-12-17 DIAGNOSIS — J4520 Mild intermittent asthma, uncomplicated: Secondary | ICD-10-CM

## 2021-12-17 MED ORDER — FLUTICASONE PROPIONATE 50 MCG/ACT NA SUSP: 1.0000 | Freq: Two times a day (BID) | NASAL | 1 refills | Status: AC

## 2021-12-17 MED ORDER — ALBUTEROL SULFATE HFA 108 (90 BASE) MCG/ACT IN AERS: 2.0000 | INHALATION_SPRAY | RESPIRATORY_TRACT | 1 refills | Status: AC | PRN

## 2021-12-17 NOTE — Patient Instructions (Addendum)
Sinus:  Use Flonase (fluticasone) nasal spray 1 spray per nostril twice a day as needed for nasal congestion.  Nasal saline spray (i.e., Simply Saline) or nasal saline lavage (i.e., NeilMed) is recommended as needed and prior to medicated nasal sprays.  Breathing: If you are using your albuterol more than twice per week after this week then let us know.  May use albuterol rescue inhaler 2 puffs every 4 to 6 hours as needed for shortness of breath, chest tightness, coughing, and wheezing. May use albuterol rescue inhaler 2 puffs 5 to 15 minutes prior to strenuous physical activities. Monitor frequency of use.    Follow up in 6 months or sooner if needed.

## 2021-12-17 NOTE — Assessment & Plan Note (Signed)
Past history - Issues with wheezing and nocturnal awakenings for 1 year. No prior asthma or COPD diagnosis. 2021 spirometry showed some restriction mainly due to body habitus. Interim history - has been doing much better and has a cpap machine now but wheezing started last week after she was working outdoors and [employer], site. Otherwise no issues and rare albuterol use. No prednisone.  Today's spirometry was normal.  If you are using your albuterol more than twice per week after this week then let us know. May use albuterol rescue inhaler 2 puffs every 4 to 6 hours as needed for shortness of breath, chest tightness, coughing, and wheezing. May use albuterol rescue inhaler 2 puffs 5 to 15 minutes prior to strenuous physical activities. Monitor frequency of use.  Declined Airsupra rescue inhaler.

## 2021-12-17 NOTE — Assessment & Plan Note (Signed)
Past history - Perennial nasal congestion, postnasal drip and headaches since spending more time at home.  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  Used Claritin with some benefit and interestingly this also helped her knee pain.  Complaining of headache/migraine and knee pain today since off Claritin for skin testing. 2021 skin prick testing showed: Negative to indoor/outdoors allergens. Bloodwork negative to environmental panel.  Use Flonase (fluticasone) nasal spray 1 spray per nostril twice a day as needed for nasal congestion.  Nasal saline spray (i.e., Simply Saline) or nasal saline lavage (i.e., NeilMed) is recommended as needed and prior to medicated nasal sprays.

## 2021-12-17 NOTE — Assessment & Plan Note (Deleted)
Past history - Issues with wheezing and nocturnal awakenings for 1 year. No prior asthma or COPD diagnosis. 2021 spirometry showed some restriction mainly due to body habitus. 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 Otherwise no issues and rare albuterol use. No prednisone.  Today's spirometry was normal.  If you are using your albuterol more than twice per week after this week then let us know. May use albuterol rescue inhaler 2 puffs every 4 to 6 hours as needed for shortness of breath, chest tightness, coughing, and wheezing. May use albuterol rescue inhaler 2 puffs 5 to 15 minutes prior to strenuous physical activities. Monitor frequency of use.  Declined Airsupra rescue inhaler.  

## 2021-12-17 NOTE — Progress Notes (Signed)
Follow Up Note  RE: Deanna Park MRN: [id number] DOB: 1955/03/03 Date of Office Visit: 12/17/2021  Referring provider: Georgina Quint, * Primary care provider: Georgina Quint, MD  Chief Complaint: Follow-up and Wheezing  History of Present Illness: I had the pleasure of seeing Deanna Park for a follow up visit at the [facility] on 12/17/2021. She is a 66 y.o. female, who is being followed for wheezing and non-allergic rhinitis. Her previous allergy office visit was on 10/28/2020 with Dr. Selena Batten. Today is a new complaint visit of wheezing, needing refills .  Wheezing Started wheezing last week - noticed it while swimming. She has been outdoors working a lot. Used albuterol with good benefit.   Patient has been sleeping better with the CPAP - now only waking up 2-3 times at night. Using albuterol once every 2 days for the past week.  No fevers/chills.  Denies any ER/urgent care visits or prednisone use since the last visit.  Non-allergic rhinitis Using Flonase in the mornings for the past week with good benefit.   Assessment and Plan: Deanna Park is a 66 y.o. female with: Mild intermittent asthma without complication Past history - Issues with wheezing and nocturnal awakenings for 1 year. No prior asthma or COPD diagnosis. 2021 spirometry showed some restriction mainly due to body habitus. Interim history - has been doing much better and has a cpap machine now but wheezing started last week after she was working outdoors and [employer], site. Otherwise no issues and rare albuterol use. No prednisone.  Today's spirometry was normal.  If you are using your albuterol more than twice per week after this week then let us know. May use albuterol rescue inhaler 2 puffs every 4 to 6 hours as needed for shortness of breath, chest tightness, coughing, and wheezing. May use albuterol rescue inhaler 2 puffs 5 to 15 minutes prior to strenuous physical  activities. Monitor frequency of use.  Declined Airsupra rescue inhaler.   Non-allergic rhinitis Past history - Perennial nasal congestion, postnasal drip and headaches since spending more time at home.  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  Used Claritin with some benefit and interestingly this also helped her knee pain.  Complaining of headache/migraine and knee pain today since off Claritin for skin testing. 2021 skin prick testing showed: Negative to indoor/outdoors allergens. Bloodwork negative to environmental panel.  Use Flonase (fluticasone) nasal spray 1 spray per nostril twice a day as needed for nasal congestion.  Nasal saline spray (i.e., Simply Saline) or nasal saline lavage (i.e., NeilMed) is recommended as needed and prior to medicated nasal sprays.  Return in about 6 months (around 06/18/2022).  Meds ordered this encounter  Medications   albuterol (VENTOLIN HFA) 108 (90 Base) MCG/ACT inhaler    Sig: Inhale 2 puffs into the lungs every 4 (four) hours as needed for wheezing or shortness of breath.    Dispense:  18 g    Refill:  1   fluticasone (FLONASE) 50 MCG/ACT nasal spray    Sig: Place 1 spray into both nostrils in the morning and at bedtime.    Dispense:  48 mL    Refill:  1    Dispense 3 month supply   Lab Orders  No laboratory test(s) ordered today    Diagnostics: Spirometry:  Tracings reviewed. Her effort: Good reproducible efforts. FVC: 2.22L FEV1: 1.84L, 83% predicted FEV1/FVC ratio: 83% Interpretation: Spirometry consistent with normal pattern.  Please see scanned spirometry results for details.  Medication List:  Current Outpatient Medications  Medication Sig Dispense Refill   aspirin EC 325 MG tablet Take 325 mg by mouth daily.     albuterol (VENTOLIN HFA) 108 (90 Base) MCG/ACT inhaler Inhale 2 puffs into the lungs every 4 (four) hours as needed for wheezing or shortness of breath. 18 g 1   fluticasone (FLONASE)  50 MCG/ACT nasal spray Place 1 spray into both nostrils in the morning and at bedtime. 48 mL 1   No current facility-administered medications for this visit.   Allergies: Allergies  Allergen Reactions   Azithromycin     Caused diarrhea   Red Dye Itching and Rash   Sulfa Antibiotics Itching and Rash   I reviewed her past medical history, social history, family history, and environmental history and no significant changes have been reported from her previous visit.  Review of Systems  Constitutional:  Negative for appetite change, chills, fever and unexpected weight change.  HENT:  Positive for congestion. Negative for postnasal drip and rhinorrhea.   Eyes:  Negative for itching.  Respiratory:  Positive for wheezing. Negative for cough, chest tightness and shortness of breath.   Cardiovascular:  Negative for chest pain.  Gastrointestinal:  Negative for abdominal pain.  Genitourinary:  Negative for difficulty urinating.  Skin:  Negative for rash.  Allergic/Immunologic: Negative for environmental allergies and food allergies.   Objective: BP 136/86 (BP Location: Left Arm, Patient Position: Sitting, Cuff Size: Large)   Pulse 64   Temp 98 F (36.7 C) (Temporal)   Resp 18   SpO2 98%  There is no height or weight on file to calculate BMI. Physical Exam Vitals and nursing note reviewed.  Constitutional:      Appearance: Normal appearance. She is well-developed. She is obese.  HENT:     Head: Normocephalic and atraumatic.     Right Ear: Tympanic membrane and external ear normal.     Left Ear: Tympanic membrane and external ear normal.     Nose: Nose normal.     Mouth/Throat:     Mouth: Mucous membranes are moist.     Pharynx: Oropharynx is clear.  Eyes:     Conjunctiva/sclera: Conjunctivae normal.  Cardiovascular:     Rate and Rhythm: Normal rate and regular rhythm.     Heart sounds: Normal heart sounds. No murmur heard.    No friction rub. No gallop.  Pulmonary:     Effort:  Pulmonary effort is normal.     Breath sounds: Normal breath sounds. No wheezing, rhonchi or rales.  Musculoskeletal:     Cervical back: Neck supple.  Skin:    General: Skin is warm.     Findings: No rash.  Neurological:     Mental Status: She is alert and oriented to person, place, and time.  Psychiatric:        Behavior: Behavior normal.    Previous notes and tests were reviewed. The plan was reviewed with the patient/family, and all questions/concerned were addressed.  It was my pleasure to see Odalis today and participate in her care. Please feel free to contact me with any questions or concerns.  Sincerely,  Wyline Mood, DO Allergy & Immunology  [facility] office: [phone] [facility] office: [phone]

## 2022-03-18 DIAGNOSIS — Z6841 Body Mass Index (BMI) 40.0 and over, adult: Secondary | ICD-10-CM | POA: Diagnosis not present

## 2022-03-18 DIAGNOSIS — G4733 Obstructive sleep apnea (adult) (pediatric): Secondary | ICD-10-CM | POA: Diagnosis not present

## 2022-03-18 DIAGNOSIS — E65 Localized adiposity: Secondary | ICD-10-CM | POA: Diagnosis not present

## 2022-04-16 DIAGNOSIS — Z6841 Body Mass Index (BMI) 40.0 and over, adult: Secondary | ICD-10-CM | POA: Diagnosis not present

## 2022-04-16 DIAGNOSIS — G4733 Obstructive sleep apnea (adult) (pediatric): Secondary | ICD-10-CM | POA: Diagnosis not present

## 2022-04-16 DIAGNOSIS — E7800 Pure hypercholesterolemia, unspecified: Secondary | ICD-10-CM | POA: Diagnosis not present

## 2022-04-16 DIAGNOSIS — F418 Other specified anxiety disorders: Secondary | ICD-10-CM | POA: Diagnosis not present

## 2022-04-16 DIAGNOSIS — Z72820 Sleep deprivation: Secondary | ICD-10-CM | POA: Diagnosis not present

## 2022-04-16 DIAGNOSIS — E65 Localized adiposity: Secondary | ICD-10-CM | POA: Diagnosis not present

## 2022-05-18 DIAGNOSIS — F418 Other specified anxiety disorders: Secondary | ICD-10-CM | POA: Diagnosis not present

## 2022-05-18 DIAGNOSIS — E7800 Pure hypercholesterolemia, unspecified: Secondary | ICD-10-CM | POA: Diagnosis not present

## 2022-05-18 DIAGNOSIS — G4733 Obstructive sleep apnea (adult) (pediatric): Secondary | ICD-10-CM | POA: Diagnosis not present

## 2022-05-18 DIAGNOSIS — E65 Localized adiposity: Secondary | ICD-10-CM | POA: Diagnosis not present

## 2022-05-18 DIAGNOSIS — Z72820 Sleep deprivation: Secondary | ICD-10-CM | POA: Diagnosis not present

## 2022-05-18 DIAGNOSIS — Z6841 Body Mass Index (BMI) 40.0 and over, adult: Secondary | ICD-10-CM | POA: Diagnosis not present

## 2022-05-28 DIAGNOSIS — K429 Umbilical hernia without obstruction or gangrene: Secondary | ICD-10-CM | POA: Diagnosis not present

## 2022-06-03 DIAGNOSIS — Z91041 Radiographic dye allergy status: Secondary | ICD-10-CM | POA: Diagnosis not present

## 2022-06-03 DIAGNOSIS — G4730 Sleep apnea, unspecified: Secondary | ICD-10-CM | POA: Diagnosis not present

## 2022-06-03 DIAGNOSIS — I441 Atrioventricular block, second degree: Secondary | ICD-10-CM | POA: Diagnosis not present

## 2022-06-03 DIAGNOSIS — Z882 Allergy status to sulfonamides status: Secondary | ICD-10-CM | POA: Diagnosis not present

## 2022-06-03 DIAGNOSIS — K420 Umbilical hernia with obstruction, without gangrene: Secondary | ICD-10-CM | POA: Diagnosis not present

## 2022-06-03 DIAGNOSIS — Z91030 Bee allergy status: Secondary | ICD-10-CM | POA: Diagnosis not present

## 2022-06-03 DIAGNOSIS — K429 Umbilical hernia without obstruction or gangrene: Secondary | ICD-10-CM | POA: Diagnosis not present

## 2022-06-03 DIAGNOSIS — Z6841 Body Mass Index (BMI) 40.0 and over, adult: Secondary | ICD-10-CM | POA: Diagnosis not present

## 2022-06-03 DIAGNOSIS — Z79899 Other long term (current) drug therapy: Secondary | ICD-10-CM | POA: Diagnosis not present

## 2022-06-10 DIAGNOSIS — R058 Other specified cough: Secondary | ICD-10-CM | POA: Diagnosis not present

## 2022-06-10 DIAGNOSIS — Z6841 Body Mass Index (BMI) 40.0 and over, adult: Secondary | ICD-10-CM | POA: Diagnosis not present

## 2022-06-10 DIAGNOSIS — G4733 Obstructive sleep apnea (adult) (pediatric): Secondary | ICD-10-CM | POA: Diagnosis not present

## 2022-06-10 DIAGNOSIS — K5909 Other constipation: Secondary | ICD-10-CM | POA: Diagnosis not present

## 2022-06-18 DIAGNOSIS — K429 Umbilical hernia without obstruction or gangrene: Secondary | ICD-10-CM | POA: Diagnosis not present

## 2022-07-08 DIAGNOSIS — G4733 Obstructive sleep apnea (adult) (pediatric): Secondary | ICD-10-CM | POA: Diagnosis not present

## 2022-07-08 DIAGNOSIS — F418 Other specified anxiety disorders: Secondary | ICD-10-CM | POA: Diagnosis not present

## 2022-07-08 DIAGNOSIS — E65 Localized adiposity: Secondary | ICD-10-CM | POA: Diagnosis not present

## 2022-07-08 DIAGNOSIS — Z6841 Body Mass Index (BMI) 40.0 and over, adult: Secondary | ICD-10-CM | POA: Diagnosis not present

## 2022-09-14 DIAGNOSIS — F418 Other specified anxiety disorders: Secondary | ICD-10-CM | POA: Diagnosis not present

## 2022-09-14 DIAGNOSIS — Z6841 Body Mass Index (BMI) 40.0 and over, adult: Secondary | ICD-10-CM | POA: Diagnosis not present

## 2022-09-14 DIAGNOSIS — G4733 Obstructive sleep apnea (adult) (pediatric): Secondary | ICD-10-CM | POA: Diagnosis not present

## 2022-09-22 DIAGNOSIS — Z79899 Other long term (current) drug therapy: Secondary | ICD-10-CM | POA: Diagnosis not present

## 2022-09-22 DIAGNOSIS — Z1159 Encounter for screening for other viral diseases: Secondary | ICD-10-CM | POA: Diagnosis not present

## 2022-09-22 DIAGNOSIS — E2839 Other primary ovarian failure: Secondary | ICD-10-CM | POA: Diagnosis not present

## 2022-09-22 DIAGNOSIS — Z6841 Body Mass Index (BMI) 40.0 and over, adult: Secondary | ICD-10-CM | POA: Diagnosis not present

## 2022-09-22 DIAGNOSIS — Z0001 Encounter for general adult medical examination with abnormal findings: Secondary | ICD-10-CM | POA: Diagnosis not present

## 2022-09-22 DIAGNOSIS — Z23 Encounter for immunization: Secondary | ICD-10-CM | POA: Diagnosis not present

## 2022-09-22 DIAGNOSIS — E7800 Pure hypercholesterolemia, unspecified: Secondary | ICD-10-CM | POA: Diagnosis not present

## 2022-09-25 ENCOUNTER — Other Ambulatory Visit: Payer: Self-pay | Admitting: Family Medicine

## 2022-09-25 DIAGNOSIS — E2839 Other primary ovarian failure: Secondary | ICD-10-CM

## 2022-10-12 DIAGNOSIS — E66813 Obesity, class 3: Secondary | ICD-10-CM | POA: Diagnosis not present

## 2022-10-12 DIAGNOSIS — Z6841 Body Mass Index (BMI) 40.0 and over, adult: Secondary | ICD-10-CM | POA: Diagnosis not present

## 2022-10-12 DIAGNOSIS — L987 Excessive and redundant skin and subcutaneous tissue: Secondary | ICD-10-CM | POA: Diagnosis not present

## 2022-10-12 DIAGNOSIS — Z5941 Food insecurity: Secondary | ICD-10-CM | POA: Diagnosis not present

## 2022-10-12 DIAGNOSIS — E65 Localized adiposity: Secondary | ICD-10-CM | POA: Diagnosis not present

## 2022-10-12 DIAGNOSIS — G4733 Obstructive sleep apnea (adult) (pediatric): Secondary | ICD-10-CM | POA: Diagnosis not present

## 2022-10-13 DIAGNOSIS — R768 Other specified abnormal immunological findings in serum: Secondary | ICD-10-CM | POA: Diagnosis not present

## 2022-11-10 DIAGNOSIS — E66813 Obesity, class 3: Secondary | ICD-10-CM | POA: Diagnosis not present

## 2022-11-10 DIAGNOSIS — E65 Localized adiposity: Secondary | ICD-10-CM | POA: Diagnosis not present

## 2022-11-10 DIAGNOSIS — E7800 Pure hypercholesterolemia, unspecified: Secondary | ICD-10-CM | POA: Diagnosis not present

## 2022-11-10 DIAGNOSIS — G4733 Obstructive sleep apnea (adult) (pediatric): Secondary | ICD-10-CM | POA: Diagnosis not present

## 2022-11-10 DIAGNOSIS — Z6841 Body Mass Index (BMI) 40.0 and over, adult: Secondary | ICD-10-CM | POA: Diagnosis not present

## 2022-12-11 DIAGNOSIS — E66813 Obesity, class 3: Secondary | ICD-10-CM | POA: Diagnosis not present

## 2022-12-11 DIAGNOSIS — G4733 Obstructive sleep apnea (adult) (pediatric): Secondary | ICD-10-CM | POA: Diagnosis not present

## 2022-12-11 DIAGNOSIS — E65 Localized adiposity: Secondary | ICD-10-CM | POA: Diagnosis not present

## 2022-12-11 DIAGNOSIS — E7800 Pure hypercholesterolemia, unspecified: Secondary | ICD-10-CM | POA: Diagnosis not present

## 2022-12-11 DIAGNOSIS — Z6841 Body Mass Index (BMI) 40.0 and over, adult: Secondary | ICD-10-CM | POA: Diagnosis not present

## 2022-12-11 DIAGNOSIS — Z72820 Sleep deprivation: Secondary | ICD-10-CM | POA: Diagnosis not present

## 2022-12-11 DIAGNOSIS — F418 Other specified anxiety disorders: Secondary | ICD-10-CM | POA: Diagnosis not present

## 2023-01-14 DIAGNOSIS — Z6841 Body Mass Index (BMI) 40.0 and over, adult: Secondary | ICD-10-CM | POA: Diagnosis not present

## 2023-01-14 DIAGNOSIS — U071 COVID-19: Secondary | ICD-10-CM | POA: Diagnosis not present

## 2023-01-14 DIAGNOSIS — M545 Low back pain, unspecified: Secondary | ICD-10-CM | POA: Diagnosis not present

## 2023-01-14 DIAGNOSIS — R030 Elevated blood-pressure reading, without diagnosis of hypertension: Secondary | ICD-10-CM | POA: Diagnosis not present

## 2023-01-14 DIAGNOSIS — R52 Pain, unspecified: Secondary | ICD-10-CM | POA: Diagnosis not present

## 2023-01-14 DIAGNOSIS — E66813 Obesity, class 3: Secondary | ICD-10-CM | POA: Diagnosis not present

## 2023-04-02 DIAGNOSIS — Z6841 Body Mass Index (BMI) 40.0 and over, adult: Secondary | ICD-10-CM | POA: Diagnosis not present

## 2023-04-02 DIAGNOSIS — E7800 Pure hypercholesterolemia, unspecified: Secondary | ICD-10-CM | POA: Diagnosis not present

## 2023-04-02 DIAGNOSIS — G4733 Obstructive sleep apnea (adult) (pediatric): Secondary | ICD-10-CM | POA: Diagnosis not present

## 2023-04-02 DIAGNOSIS — E66813 Obesity, class 3: Secondary | ICD-10-CM | POA: Diagnosis not present

## 2023-04-02 DIAGNOSIS — E65 Localized adiposity: Secondary | ICD-10-CM | POA: Diagnosis not present

## 2023-04-02 DIAGNOSIS — J309 Allergic rhinitis, unspecified: Secondary | ICD-10-CM | POA: Diagnosis not present

## 2023-05-26 ENCOUNTER — Other Ambulatory Visit: Payer: BC Managed Care – PPO

## 2023-07-20 DIAGNOSIS — E65 Localized adiposity: Secondary | ICD-10-CM | POA: Diagnosis not present

## 2023-07-20 DIAGNOSIS — Z6841 Body Mass Index (BMI) 40.0 and over, adult: Secondary | ICD-10-CM | POA: Diagnosis not present

## 2023-07-20 DIAGNOSIS — E66813 Obesity, class 3: Secondary | ICD-10-CM | POA: Diagnosis not present

## 2023-07-20 DIAGNOSIS — G4733 Obstructive sleep apnea (adult) (pediatric): Secondary | ICD-10-CM | POA: Diagnosis not present

## 2023-07-20 DIAGNOSIS — E7800 Pure hypercholesterolemia, unspecified: Secondary | ICD-10-CM | POA: Diagnosis not present

## 2023-09-23 ENCOUNTER — Other Ambulatory Visit (HOSPITAL_BASED_OUTPATIENT_CLINIC_OR_DEPARTMENT_OTHER): Payer: Self-pay | Admitting: Family Medicine

## 2023-09-23 DIAGNOSIS — E2839 Other primary ovarian failure: Secondary | ICD-10-CM

## 2023-12-07 DIAGNOSIS — E65 Localized adiposity: Secondary | ICD-10-CM | POA: Diagnosis not present

## 2023-12-07 DIAGNOSIS — E66813 Obesity, class 3: Secondary | ICD-10-CM | POA: Diagnosis not present

## 2023-12-07 DIAGNOSIS — E7800 Pure hypercholesterolemia, unspecified: Secondary | ICD-10-CM | POA: Diagnosis not present

## 2023-12-07 DIAGNOSIS — G4733 Obstructive sleep apnea (adult) (pediatric): Secondary | ICD-10-CM | POA: Diagnosis not present

## 2023-12-07 DIAGNOSIS — Z6841 Body Mass Index (BMI) 40.0 and over, adult: Secondary | ICD-10-CM | POA: Diagnosis not present

## 2023-12-09 DIAGNOSIS — G4733 Obstructive sleep apnea (adult) (pediatric): Secondary | ICD-10-CM | POA: Diagnosis not present
